# Patient Record
Sex: Male | Born: 1990 | Race: White | Hispanic: No | Smoking: Current some day smoker
Health system: Southern US, Community
[De-identification: ages and names within clinical notes are randomized; demographics above are authoritative.]

## PROBLEM LIST (undated history)

## (undated) DIAGNOSIS — F329 Major depressive disorder, single episode, unspecified: Secondary | ICD-10-CM

## (undated) DIAGNOSIS — R0600 Dyspnea, unspecified: Secondary | ICD-10-CM

## (undated) DIAGNOSIS — F419 Anxiety disorder, unspecified: Secondary | ICD-10-CM

## (undated) DIAGNOSIS — F32A Depression, unspecified: Secondary | ICD-10-CM

## (undated) DIAGNOSIS — I10 Essential (primary) hypertension: Secondary | ICD-10-CM

## (undated) NOTE — *Deleted (*Deleted)
Patient ID: Samuel Tate, male   DOB: July 13, 1990, 14 y.o.   MRN: [id number] Patient discharged to home/self care on his own accord.  Patient denies SI, HI and AVH

---
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
## 2019-01-03 ENCOUNTER — Ambulatory Visit (INDEPENDENT_AMBULATORY_CARE_PROVIDER_SITE_OTHER): Payer: 59 | Admitting: Orthopaedic Surgery

## 2019-01-03 ENCOUNTER — Ambulatory Visit (INDEPENDENT_AMBULATORY_CARE_PROVIDER_SITE_OTHER): Payer: 59

## 2019-01-03 ENCOUNTER — Encounter: Payer: Self-pay | Admitting: Orthopaedic Surgery

## 2019-01-03 ENCOUNTER — Other Ambulatory Visit: Payer: Self-pay

## 2019-01-03 DIAGNOSIS — M87051 Idiopathic aseptic necrosis of right femur: Secondary | ICD-10-CM

## 2019-01-03 DIAGNOSIS — M25552 Pain in left hip: Secondary | ICD-10-CM

## 2019-01-03 DIAGNOSIS — M25551 Pain in right hip: Secondary | ICD-10-CM

## 2019-01-03 DIAGNOSIS — M87052 Idiopathic aseptic necrosis of left femur: Secondary | ICD-10-CM | POA: Insufficient documentation

## 2019-01-03 NOTE — Progress Notes (Signed)
Office Visit Note   Patient: Samuel Tate           Date of Birth: Aug 31, 1990           MRN: [id number] Visit Date: 01/03/2019              Requested by: No referring provider defined for this encounter. PCP: No primary care provider on file.   Assessment & Plan: Visit Diagnoses:  1. Pain in left hip   2. Pain in right hip   3. Avascular necrosis of bone of hip, left ([place])   4. Avascular necrosis of bone of hip, right ([place])     Plan: At this point I do feel that he would benefit from bilateral total hip arthroplasties.  I am comfortable with doing these both at once when also comfortable with performing one hip replacement and then doing the other one at a later date depending on his recovery.  I do not think there is good options for him now nor does he.  He is only 28 years old but his impending femoral head collapse is significant and his pain is significant and severe.  It is affecting his gait to such an extent that I do feel that hip replacement are medically necessary and warranted.  I showed him a hip model explained in detail what the surgery involves.  We talked about the risk and benefits of surgery.  We discussed his interoperative and postoperative course.  All questions concerns were answered and addressed.  He is going to talk to his family.  He has our surgery scheduler's card but we can work on released considering a surgical date for sometime in late November or early December.  Follow-Up Instructions: Return for 2 weeks post-op.   Orders:  Orders Placed This Encounter  Procedures  . XR HIPS BILAT W OR W/O PELVIS 2V   No orders of the defined types were placed in this encounter.     Procedures: No procedures performed   Clinical Data: No additional findings.   Subjective: Chief Complaint  Patient presents with  . Left Hip - Pain  . Right Hip - Pain  The patient is a very pleasant 28 year old gentleman with bilateral hip avascular necrosis that is been  well-documented.  He was followed in [place] and MRIs confirmed bilateral hip AVN.  His hip pain is gotten rapidly worse over the last 6 months.  He walks with a significant Trendelenburg gait.  His hip pain is daily and is detrimentally affecting his mobility, his quality of life, and his actives of daily living.  It is 10 out of 10.  He does report that he has been on steroids off and on the past since a young age for different things.  He has drank alcohol in the past but not to any excessive amount he states.  He was told in [place] that he would eventually need hip replacements but they were trying to hold off given his young age of 26.  He weighs probably about 245 pounds.  They did try alendronate to see if this would help with his bone and pain but it has not.  He is a very active individual.  He is relocating to this area.  He is not a diabetic.  HPI  Review of Systems He currently denies any headache, chest pain, shortness of breath, fever, chills, nausea, vomiting  Objective: Vital Signs: There were no vitals taken for this visit.  Physical Exam He is  alert and oriented x3 and in no acute distress Ortho Exam He walks with a significant Trendelenburg gait and has significant pain with ambulating.  On the exam table, his hips are very painful with attempts of internal and external rotation and very stiff. Specialty Comments:  No specialty comments available.  Imaging: No results found.   PMFS History: Patient Active Problem List   Diagnosis Date Noted  . Avascular necrosis of bone of hip, left ([facility]) 01/03/2019  . Avascular necrosis of bone of hip, right ([facility]) 01/03/2019   History reviewed. No pertinent past medical history.  History reviewed. No pertinent family history.  History reviewed. No pertinent surgical history. Social History   Occupational History  . Not on file  Tobacco Use  . Smoking status: Not on file  Substance and Sexual Activity  . Alcohol use: Not on  file  . Drug use: Not on file  . Sexual activity: Not on file

## 2019-01-10 ENCOUNTER — Telehealth: Payer: Self-pay | Admitting: Orthopaedic Surgery

## 2019-01-10 NOTE — Telephone Encounter (Signed)
Pt mother Elyn Aquas called in for pt trying to reach sherrie, pt is very upset due to her leaving 3 messages since Thursday and hasn't received a call back. Pt mother says her son is moving from [place] to [place] so they need to put in place a date and time for surgery.   Please give her a call [phone]

## 2019-01-10 NOTE — Telephone Encounter (Signed)
I called patient's mom and scheduled surgery.

## 2019-02-20 ENCOUNTER — Other Ambulatory Visit: Payer: Self-pay

## 2019-02-25 ENCOUNTER — Other Ambulatory Visit: Payer: Self-pay | Admitting: Physician Assistant

## 2019-02-26 ENCOUNTER — Other Ambulatory Visit: Payer: Self-pay

## 2019-02-26 ENCOUNTER — Other Ambulatory Visit (HOSPITAL_COMMUNITY)
Admission: RE | Admit: 2019-02-26 | Discharge: 2019-02-26 | Disposition: A | Discharge status: Home / Self Care | Payer: 59 | Source: Ambulatory Visit | Attending: Orthopaedic Surgery | Admitting: Orthopaedic Surgery

## 2019-02-26 ENCOUNTER — Other Ambulatory Visit (HOSPITAL_COMMUNITY): Payer: 59

## 2019-02-26 DIAGNOSIS — Z01812 Encounter for preprocedural laboratory examination: Secondary | ICD-10-CM | POA: Insufficient documentation

## 2019-02-26 DIAGNOSIS — Z20828 Contact with and (suspected) exposure to other viral communicable diseases: Secondary | ICD-10-CM | POA: Insufficient documentation

## 2019-02-26 LAB — SARS CORONAVIRUS 2 (TAT 6-24 HRS): SARS Coronavirus 2: NEGATIVE

## 2019-02-26 NOTE — Patient Instructions (Signed)
DUE TO COVID-19 ONLY ONE VISITOR IS ALLOWED TO COME WITH YOU AND STAY IN THE WAITING ROOM ONLY DURING PRE OP AND PROCEDURE DAY OF SURGERY. THE 1 VISITOR MAY VISIT WITH YOU AFTER SURGERY IN YOUR PRIVATE ROOM DURING VISITING HOURS ONLY!   ONCE YOUR COVID TEST IS COMPLETED, PLEASE BEGIN THE QUARANTINE INSTRUCTIONS AS OUTLINED IN YOUR HANDOUT.                Samuel Tate    Your procedure is scheduled on: 03/01/19   Report to [facility] Main  Entrance   Report to admitting at   9:45 AM     Call this number if you have problems the morning of surgery [phone]   . BRUSH YOUR TEETH MORNING OF SURGERY AND RINSE YOUR MOUTH OUT, NO CHEWING GUM CANDY OR MINTS.   Do not eat food After Midnight.   YOU MAY HAVE CLEAR LIQUIDS FROM MIDNIGHT UNTIL 9:00 AM    CLEAR LIQUID DIET   Foods Allowed                                                                     Foods Excluded  Coffee and tea, regular and decaf                             liquids that you cannot  Plain Jell-O any favor except red or purple                                           see through such as: Fruit ices (not with fruit pulp)                                     milk, soups, orange juice  Iced Popsicles                                    All solid food Carbonated beverages, regular and diet                                    Cranberry, grape and apple juices Sports drinks like Gatorade Lightly seasoned clear broth or consume(fat free) Sugar, honey syrup   . At 9:00 AM Please finish the prescribed Pre-Surgery  drink.   Nothing by mouth after you finish the  drink !    Take these medicines the morning of surgery with A SIP OF WATER: Prozac, Metoprolol, Fosomax.             Use your inhaler and bring it to the hospital with you                                 You may not have any metal on your body including piercings  Do not wear jewelry,  lotions, powders or deodorant       .   Men may shave face and neck.   Do not bring valuables to the hospital. [facility] IS NOT             RESPONSIBLE   FOR VALUABLES.  Contacts, dentures or bridgework may not be worn into surgery.       Special Instructions: N/A              Please read over the following fact sheets you were given: _____________________________________________________________________             [facility]  Before surgery, you can play an important role.   Because skin is not sterile, your skin needs to be as free of germs as possible.   You can reduce the number of germs on your skin by washing with CHG (chlorahexidine gluconate) soap before surgery.   CHG is an antiseptic cleaner which kills germs and bonds with the skin to continue killing germs even after washing. Please DO NOT use if you have an allergy to CHG or antibacterial soaps.   If your skin becomes reddened/irritated stop using the CHG and inform your nurse when you arrive at Short Stay.   You may shave your face/neck.  Please follow these instructions carefully:  1.  Shower with CHG Soap the night before surgery and the  morning of Surgery.  2.  If you choose to wash your hair, wash your hair first as usual with your  normal  shampoo.  3.  After you shampoo, rinse your hair and body thoroughly to remove the  shampoo.                                        4.  Use CHG as you would any other liquid soap.  You can apply chg directly  to the skin and wash                       Gently with a scrungie or clean washcloth.  5.  Apply the CHG Soap to your body ONLY FROM THE NECK DOWN.   Do not use on face/ open                           Wound or open sores. Avoid contact with eyes, ears mouth and genitals (private parts).                       Wash face,  Genitals (private parts) with your normal soap.             6.  Wash thoroughly, paying special attention to the area where your surgery  will be performed.  7.   Thoroughly rinse your body with warm water from the neck down.  8.  DO NOT shower/wash with your normal soap after using and rinsing off  the CHG Soap.             9.  Pat yourself dry with a clean towel.            10.  Wear clean pajamas.            11.  Place clean sheets on your bed the night of your first  shower and do not  sleep with pets. Day of Surgery : Do not apply any lotions/deodorants the morning of surgery.  Please wear clean clothes to the hospital/surgery center.  FAILURE TO FOLLOW THESE INSTRUCTIONS MAY RESULT IN THE CANCELLATION OF YOUR SURGERY PATIENT SIGNATURE_________________________________  NURSE SIGNATURE__________________________________  ________________________________________________________________________   [name]  An incentive spirometer is a tool that can help keep your lungs clear and active. This tool measures how well you are filling your lungs with each breath. Taking long deep breaths may help reverse or decrease the chance of developing breathing (pulmonary) problems (especially infection) following:  A long period of time when you are unable to move or be active. BEFORE THE PROCEDURE   If the spirometer includes an indicator to show your best effort, your nurse or respiratory therapist will set it to a desired goal.  If possible, sit up straight or lean slightly forward. Try not to slouch.  Hold the incentive spirometer in an upright position. INSTRUCTIONS FOR USE  1. Sit on the edge of your bed if possible, or sit up as far as you can in bed or on a chair. 2. Hold the incentive spirometer in an upright position. 3. Breathe out normally. 4. Place the mouthpiece in your mouth and seal your lips tightly around it. 5. Breathe in slowly and as deeply as possible, raising the piston or the ball toward the top of the column. 6. Hold your breath for 3-5 seconds or for as long as possible. Allow the piston or ball to fall to the bottom of  the column. 7. Remove the mouthpiece from your mouth and breathe out normally. 8. Rest for a few seconds and repeat Steps 1 through 7 at least 10 times every 1-2 hours when you are awake. Take your time and take a few normal breaths between deep breaths. 9. The spirometer may include an indicator to show your best effort. Use the indicator as a goal to work toward during each repetition. 10. After each set of 10 deep breaths, practice coughing to be sure your lungs are clear. If you have an incision (the cut made at the time of surgery), support your incision when coughing by placing a pillow or rolled up towels firmly against it. Once you are able to get out of bed, walk around indoors and cough well. You may stop using the incentive spirometer when instructed by your caregiver.  RISKS AND COMPLICATIONS  Take your time so you do not get dizzy or light-headed.  If you are in pain, you may need to take or ask for pain medication before doing incentive spirometry. It is harder to take a deep breath if you are having pain. AFTER USE  Rest and breathe slowly and easily.  It can be helpful to keep track of a log of your progress. Your caregiver can provide you with a simple table to help with this. If you are using the spirometer at home, follow these instructions: [place] IF:   You are having difficultly using the spirometer.  You have trouble using the spirometer as often as instructed.  Your pain medication is not giving enough relief while using the spirometer.  You develop fever of 100.5 F (38.1 C) or higher. SEEK IMMEDIATE MEDICAL CARE IF:   You cough up bloody sputum that had not been present before.  You develop fever of 102 F (38.9 C) or greater.  You develop worsening pain at or near the incision site. MAKE  SURE YOU:   Understand these instructions.  Will watch your condition.  Will get help right away if you are not doing well or get worse. Document  Released: 07/18/2006 Document Revised: 05/30/2011 Document Reviewed: 09/18/2006 [facility] Patient Information 2014 [place], [place].   ________________________________________________________________________

## 2019-02-27 ENCOUNTER — Encounter (HOSPITAL_COMMUNITY)
Admission: RE | Admit: 2019-02-27 | Discharge: 2019-02-27 | Disposition: A | Discharge status: Home / Self Care | Payer: 59 | Source: Ambulatory Visit | Attending: Orthopaedic Surgery | Admitting: Orthopaedic Surgery

## 2019-02-27 ENCOUNTER — Ambulatory Visit
Admission: EM | Admit: 2019-02-27 | Discharge: 2019-02-27 | Disposition: A | Discharge status: Home / Self Care | Payer: 59 | Source: Home / Self Care

## 2019-02-27 ENCOUNTER — Other Ambulatory Visit: Payer: Self-pay

## 2019-02-27 ENCOUNTER — Encounter (HOSPITAL_COMMUNITY): Payer: Self-pay

## 2019-02-27 DIAGNOSIS — I10 Essential (primary) hypertension: Secondary | ICD-10-CM

## 2019-02-27 DIAGNOSIS — Z01818 Encounter for other preprocedural examination: Secondary | ICD-10-CM | POA: Insufficient documentation

## 2019-02-27 DIAGNOSIS — M87059 Idiopathic aseptic necrosis of unspecified femur: Secondary | ICD-10-CM | POA: Insufficient documentation

## 2019-02-27 DIAGNOSIS — E669 Obesity, unspecified: Secondary | ICD-10-CM

## 2019-02-27 HISTORY — DX: Depression, unspecified: F32.A

## 2019-02-27 HISTORY — DX: Essential (primary) hypertension: I10

## 2019-02-27 HISTORY — DX: Dyspnea, unspecified: R06.00

## 2019-02-27 HISTORY — DX: Anxiety disorder, unspecified: F41.9

## 2019-02-27 LAB — BASIC METABOLIC PANEL
Anion gap: 12 (ref 5–15)
BUN: 14 mg/dL (ref 6–20)
CO2: 26 mmol/L (ref 22–32)
Calcium: 9.4 mg/dL (ref 8.9–10.3)
Chloride: 101 mmol/L (ref 98–111)
Creatinine, Ser: 1.01 mg/dL (ref 0.61–1.24)
GFR calc Af Amer: >60 mL/min (ref >60)
GFR calc non Af Amer: >60 mL/min (ref >60)
Glucose, Bld: 97 mg/dL (ref 70–99)
Potassium: 3.9 mmol/L (ref 3.5–5.1)
Sodium: 139 mmol/L (ref 135–145)

## 2019-02-27 LAB — CBC
HCT: 48.4 % (ref 39.0–52.0)
Hemoglobin: 15.8 g/dL (ref 13.0–17.0)
MCH: 30.6 pg (ref 26.0–34.0)
MCHC: 32.6 g/dL (ref 30.0–36.0)
MCV: 93.8 fL (ref 80.0–100.0)
Platelets: 483 10*3/uL — ABNORMAL HIGH (ref 150–400)
RBC: 5.16 MIL/uL (ref 4.22–5.81)
RDW: 12.5 % (ref 11.5–15.5)
WBC: 10 10*3/uL (ref 4.0–10.5)
nRBC: 0 % (ref 0.0–0.2)

## 2019-02-27 LAB — SURGICAL PCR SCREEN
MRSA, PCR: NEGATIVE
Staphylococcus aureus: POSITIVE — AB

## 2019-02-27 LAB — ABO/RH: ABO/RH(D): O POS

## 2019-02-27 MED ORDER — AMLODIPINE BESYLATE 2.5 MG PO TABS: 2.5000 mg | ORAL_TABLET | Freq: Every day | ORAL | 0 refills | Status: DC

## 2019-02-27 NOTE — ED Provider Notes (Signed)
[facility]   MRN: [id number] DOB: [birth year]  Subjective:   Samuel Tate is a 28 y.o. male presenting for check on his hypertension.  Patient was having blood work drawn today for preop labs as he is having bilateral hip replacement due to bilateral avascular necrosis of the bone.  He was found to have an elevated blood pressure there and was advised to come to our urgent care for checkup.  He is currently taking losartan, hydrochlorothiazide and metoprolol.   No current facility-administered medications for this encounter.   Current Outpatient Medications:  .  acetaminophen (TYLENOL) 500 MG tablet, Take 1,000 mg by mouth every 6 (six) hours as needed for moderate pain., Disp: , Rfl:  .  albuterol (VENTOLIN HFA) 108 (90 Base) MCG/ACT inhaler, Inhale 1-2 puffs into the lungs every 6 (six) hours as needed for wheezing or shortness of breath., Disp: , Rfl:  .  alendronate (FOSAMAX) 70 MG tablet, Take 70 mg by mouth once a week. Take with a full glass of water on an empty stomach., Disp: , Rfl:  .  diphenhydramine-acetaminophen (TYLENOL PM) 25-500 MG TABS tablet, Take 2 tablets by mouth at bedtime as needed (sleep/pain)., Disp: , Rfl:  .  FLUoxetine (PROZAC) 20 MG capsule, Take 20 mg by mouth daily., Disp: , Rfl:  .  hydrochlorothiazide (HYDRODIURIL) 25 MG tablet, Take 25 mg by mouth daily., Disp: , Rfl:  .  losartan-hydrochlorothiazide (HYZAAR) 100-25 MG tablet, Take 1 tablet by mouth daily., Disp: , Rfl:  .  metoprolol succinate (TOPROL-XL) 25 MG 24 hr tablet, Take 25 mg by mouth daily., Disp: , Rfl:  .  omeprazole (PRILOSEC) 20 MG capsule, Take 20 mg by mouth daily., Disp: , Rfl:  .  QUEtiapine (SEROQUEL) 100 MG tablet, Take 100 mg by mouth at bedtime., Disp: , Rfl:     No Known Allergies   Past Medical History:  Diagnosis Date  . Anxiety   . Depression   . Dyspnea    with panic attacks. uses inhaler  . Family history of adverse reaction to anesthesia    grandmother  has PONV  . Hypertension      Past Surgical History:  Procedure Laterality Date  . FRACTURE SURGERY Right 2010    Family History  Problem Relation Age of Onset  . Healthy Mother   . Healthy Father     Social History   Tobacco Use  . Smoking status: Current Some Day Smoker    Years: 4.00    Types: Cigars  . Smokeless tobacco: Current User    Types: Snuff  Substance Use Topics  . Alcohol use: Yes    Alcohol/week: 6.0 standard drinks    Types: 6 Standard drinks or equivalent per week  . Drug use: Never    Review of Systems  Constitutional: Negative for fever and malaise/fatigue.  HENT: Negative for congestion, ear pain, sinus pain and sore throat.   Eyes: Negative for discharge and redness.  Respiratory: Negative for cough, hemoptysis, shortness of breath and wheezing.   Cardiovascular: Negative for chest pain.  Gastrointestinal: Negative for abdominal pain, blood in stool, constipation, diarrhea, nausea and vomiting.  Genitourinary: Negative for dysuria, flank pain and hematuria.  Musculoskeletal: Positive for joint pain (chronic hip pain). Negative for myalgias.  Skin: Negative for rash.  Neurological: Negative for dizziness, tingling, tremors, sensory change, speech change, focal weakness, weakness and headaches.  Psychiatric/Behavioral: Negative for depression and substance abuse.     Objective:   Vitals: BP Marland Kitchen(!)  143/90 (BP Location: Right Arm)   Pulse 75   Temp 98.9 F (37.2 C) (Oral)   Resp 18   SpO2 96%   BP Readings from Last 3 Encounters:  02/27/19 (!) 143/90  02/27/19 (!) 149/105   Physical Exam Constitutional:      General: He is not in acute distress.    Appearance: Normal appearance. He is well-developed. He is obese. He is not ill-appearing, toxic-appearing or diaphoretic.  HENT:     Head: Normocephalic and atraumatic.     Right Ear: External ear normal.     Left Ear: External ear normal.     Nose: Nose normal.     Mouth/Throat:      Mouth: Mucous membranes are moist.     Pharynx: Oropharynx is clear.  Eyes:     General: No scleral icterus.    Extraocular Movements: Extraocular movements intact.     Pupils: Pupils are equal, round, and reactive to light.  Neck:     Musculoskeletal: Normal range of motion and neck supple. No neck rigidity.  Cardiovascular:     Rate and Rhythm: Normal rate and regular rhythm.     Heart sounds: Normal heart sounds. No murmur. No friction rub. No gallop.   Pulmonary:     Effort: Pulmonary effort is normal. No respiratory distress.     Breath sounds: Normal breath sounds. No stridor. No wheezing, rhonchi or rales.  Skin:    General: Skin is warm and dry.  Neurological:     Mental Status: He is alert and oriented to person, place, and time.     Cranial Nerves: No cranial nerve deficit.     Motor: No weakness.     Coordination: Coordination normal.     Gait: Gait normal.     Deep Tendon Reflexes: Reflexes normal.     Comments: Negative Romberg and pronator drift.  Psychiatric:        Mood and Affect: Mood normal.        Behavior: Behavior normal.        Thought Content: Thought content normal.        Judgment: Judgment normal.     Results for orders placed or performed during the hospital encounter of 02/27/19 (from the past 24 hour(s))  Type and screen Order type and screen if day of surgery is less than 15 days from draw of preadmission visit or order morning of surgery if day of surgery is greater than 6 days from preadmission visit.     Status: None   Collection Time: 02/27/19 11:00 AM  Result Value Ref Range   ABO/RH(D) O POS    Antibody Screen NEG    Sample Expiration 03/13/2019,2359    Extend sample reason      NO TRANSFUSIONS OR PREGNANCY IN THE PAST 3 MONTHS Performed at [facility], [address], [place], [place] [address]   Basic metabolic panel     Status: None   Collection Time: 02/27/19 11:00 AM  Result Value Ref Range   Sodium 139 135 -  145 mmol/L   Potassium 3.9 3.5 - 5.1 mmol/L   Chloride 101 98 - 111 mmol/L   CO2 26 22 - 32 mmol/L   Glucose, Bld 97 70 - 99 mg/dL   BUN 14 6 - 20 mg/dL   Creatinine, Ser 7.34 0.61 - 1.24 mg/dL   Calcium 9.4 8.9 - 28.7 mg/dL   GFR calc non Af Amer >60 >60 mL/min   GFR  calc Af Amer >60 >60 mL/min   Anion gap 12 5 - 15  CBC     Status: Abnormal   Collection Time: 02/27/19 11:00 AM  Result Value Ref Range   WBC 10.0 4.0 - 10.5 K/uL   RBC 5.16 4.22 - 5.81 MIL/uL   Hemoglobin 15.8 13.0 - 17.0 g/dL   HCT 48.4 39.0 - 52.0 %   MCV 93.8 80.0 - 100.0 fL   MCH 30.6 26.0 - 34.0 pg   MCHC 32.6 30.0 - 36.0 g/dL   RDW 12.5 11.5 - 15.5 %   Platelets 483 (H) 150 - 400 K/uL   nRBC 0.0 0.0 - 0.2 %  ABO/Rh     Status: None (Preliminary result)   Collection Time: 02/27/19 11:00 AM  Result Value Ref Range   ABO/RH(D)      O POS Performed at [facility], [place] [address], [place], [place] [address]     Assessment and Plan :   1. Essential hypertension   2. Elevated blood pressure reading in office with diagnosis of hypertension   3. Obesity, unspecified classification, unspecified obesity type, unspecified whether serious comorbidity present     Patient is asymptomatic and borderline uncontrolled.  However, this is the first time I see the patient today and are not have previous readings for comparison.  Patient is very weary of having his surgery canceled due to his blood pressure.  Therefore, I was agreeable to using amlodipine at 2.5 mg once daily to help with this. Counseled patient on potential for adverse effects with medications prescribed/recommended today, ER and return-to-clinic precautions discussed, patient verbalized understanding.    Jaynee Eagles, PA-C 02/27/19 1356

## 2019-02-27 NOTE — ED Triage Notes (Signed)
Pt presents to UC w/ c/o high blood pressure. Pt states he has been taking HBP medication as prescribed. Pt had a surgery preauthorization today where his BP was 141/101, so they told him to come to UC to be evaluated. [place] and does not have a pcp at this time.

## 2019-02-27 NOTE — Discharge Instructions (Signed)
For your diabetes, please make sure you are avoiding starchy, carbohydrate foods like pasta, breads, pastry, rice, potatoes, desserts. These foods can elevated your blood sugar. Also, limit your alcohol drinking to 1 per day, avoid sodas, sweet teas. For elevated blood pressure, make sure you are monitoring salt in your diet.  Do not eat restaurant foods and limit processed foods at home.  Processed foods include things like frozen meals preseason meats and dinners.  Make sure your pain attention to sodium labels on foods you by at the grocery store.  For seasoning you can use a brand called Mrs. Dash which includes a lot of salt free seasonings. ° °Salads - kale, spinach, cabbage, spring mix; use seeds like pumpkin seeds or sunflower seeds, almonds; you can also use 1-2 hard boiled eggs in your salads °Fruits - avocadoes, berries (blueberries, raspberries, blackberries), apples, oranges °Vegetables - aspargus, cauliflower, broccoli, green beans, brussel spouts, bell peppers; stay away from starchy vegetables like potatoes, carrots, peas ° °Regarding meat it is better to eat lean meats and limit your red meat consumption including pork.  Wild caught fish, chicken breast are good options. ° °Do not eat any foods on this list that you are allergic to. ° °

## 2019-02-27 NOTE — Progress Notes (Addendum)
PCP - none in the area Cardiologist - none  Chest x-ray - no EKG - 02/27/19 Stress Test - no ECHO - no Cardiac Cath - no  Sleep Study - no CPAP -   Fasting Blood Sugar - NA Checks Blood Sugar _____ times a day  Blood Thinner Instructions: Aspirin Instructions:Na Last Dose:  Anesthesia review:   Patient denies shortness of breath, fever, cough and chest pain at PAT appointment yes  Patient verbalized understanding of instructions that were given to them at the PAT appointment. Patient was also instructed that they will need to review over the PAT instructions again at home before surgery. Yes BP was elevated at PAT visit. Pt is new to the area. He was told to see an urgent care to get his BP under control prior to surgery

## 2019-02-28 ENCOUNTER — Other Ambulatory Visit (HOSPITAL_COMMUNITY): Payer: 59

## 2019-02-28 ENCOUNTER — Encounter (HOSPITAL_COMMUNITY): Payer: Self-pay | Admitting: Orthopaedic Surgery

## 2019-02-28 DIAGNOSIS — M87052 Idiopathic aseptic necrosis of left femur: Secondary | ICD-10-CM

## 2019-02-28 DIAGNOSIS — M87051 Idiopathic aseptic necrosis of right femur: Secondary | ICD-10-CM

## 2019-02-28 HISTORY — DX: Idiopathic aseptic necrosis of right femur: M87.051

## 2019-02-28 HISTORY — DX: Idiopathic aseptic necrosis of left femur: M87.052

## 2019-02-28 NOTE — Anesthesia Preprocedure Evaluation (Addendum)
Anesthesia Evaluation  Patient identified by MRN, date of birth, ID band Patient awake    Reviewed: Allergy & Precautions, NPO status , Patient's Chart, lab work & pertinent test results  Airway Mallampati: II  TM Distance: >3 FB Neck ROM: Full    Dental  (+) Teeth Intact, Dental Advisory Given,    Pulmonary shortness of breath, Current Smoker,  Albuterol use - a few days, no formal diagnosis of asthma   Pulmonary exam normal breath sounds clear to auscultation       Cardiovascular hypertension, Pt. on medications Normal cardiovascular exam Rhythm:Regular Rate:Normal     Neuro/Psych PSYCHIATRIC DISORDERS Anxiety Depression negative neurological ROS     GI/Hepatic negative GI ROS, Neg liver ROS,   Endo/Other  Morbid obesityBMI 40  Renal/GU negative Renal ROS  negative genitourinary   Musculoskeletal B/L avascular necrosis of hips   Abdominal (+) + obese,   Peds  Hematology negative hematology ROS (+)   Anesthesia Other Findings   Reproductive/Obstetrics negative OB ROS                            Anesthesia Physical Anesthesia Plan  ASA: III  Anesthesia Plan: Spinal and MAC   Post-op Pain Management:    Induction:   PONV Risk Score and Plan: 1 and Propofol infusion, TIVA and Treatment may vary due to age or medical condition  Airway Management Planned: Natural Airway and Simple Face Mask  Additional Equipment: None  Intra-op Plan:   Post-operative Plan:   Informed Consent: I have reviewed the patients History and Physical, chart, labs and discussed the procedure including the risks, benefits and alternatives for the proposed anesthesia with the patient or authorized representative who has indicated his/her understanding and acceptance.       Plan Discussed with: CRNA  Anesthesia Plan Comments:        Anesthesia Quick Evaluation

## 2019-02-28 NOTE — H&P (Signed)
TOTAL HIP ADMISSION H&P  Patient is admitted for bilaterally total hip arthroplasty.  Subjective:  Chief Complaint: bilaterally hip pain  HPI: Samuel Tate, 28 y.o. male, has a history of pain and functional disability in the bilaterally hip(s) due to avascular necrosis and patient has failed non-surgical conservative treatments for greater than 12 weeks to include NSAID's and/or analgesics, corticosteriod injections, flexibility and strengthening excercises, weight reduction as appropriate and activity modification.  Onset of symptoms was abrupt starting 1 years ago with rapidlly worsening course since that time.The patient noted no past surgery on the bilaterally hip(s).  Patient currently rates pain in the bilaterally hip at 10 out of 10 with activity. Patient has night pain, worsening of pain with activity and weight bearing, trendelenberg gait, pain that interfers with activities of daily living and pain with passive range of motion. Patient has evidence of subchondral cysts by imaging studies. This condition presents safety issues increasing the risk of falls.  There is no current active infection.  Patient Active Problem List   Diagnosis Date Noted  . Avascular necrosis of bones of both hips (HCC) 02/28/2019  . Avascular necrosis of bone of hip, left (HCC) 01/03/2019  . Avascular necrosis of bone of hip, right (HCC) 01/03/2019   Past Medical History:  Diagnosis Date  . Anxiety   . Depression   . Dyspnea    with panic attacks. uses inhaler  . Hypertension     Past Surgical History:  Procedure Laterality Date  . FRACTURE SURGERY Right 2010    No current facility-administered medications for this encounter.   Current Outpatient Medications  Medication Sig Dispense Refill Last Dose  . acetaminophen (TYLENOL) 500 MG tablet Take 1,000 mg by mouth every 6 (six) hours as needed for moderate pain.     Marland Kitchen albuterol (VENTOLIN HFA) 108 (90 Base) MCG/ACT inhaler Inhale 1-2 puffs into the lungs  every 6 (six) hours as needed for wheezing or shortness of breath.     Marland Kitchen alendronate (FOSAMAX) 70 MG tablet Take 70 mg by mouth once a week. Take with a full glass of water on an empty stomach.     . diphenhydramine-acetaminophen (TYLENOL PM) 25-500 MG TABS tablet Take 2 tablets by mouth at bedtime as needed (sleep/pain).     Marland Kitchen FLUoxetine (PROZAC) 20 MG capsule Take 20 mg by mouth daily.     . hydrochlorothiazide (HYDRODIURIL) 25 MG tablet Take 25 mg by mouth daily.     Marland Kitchen losartan-hydrochlorothiazide (HYZAAR) 100-25 MG tablet Take 1 tablet by mouth daily.     . metoprolol succinate (TOPROL-XL) 25 MG 24 hr tablet Take 25 mg by mouth daily.     Marland Kitchen omeprazole (PRILOSEC) 20 MG capsule Take 20 mg by mouth daily.     . QUEtiapine (SEROQUEL) 100 MG tablet Take 100 mg by mouth at bedtime.     Marland Kitchen amLODipine (NORVASC) 2.5 MG tablet Take 1 tablet (2.5 mg total) by mouth daily. 30 tablet 0    No Known Allergies  Social History   Tobacco Use  . Smoking status: Current Some Day Smoker    Years: 4.00    Types: Cigars  . Smokeless tobacco: Current User    Types: Snuff  Substance Use Topics  . Alcohol use: Yes    Alcohol/week: 6.0 standard drinks    Types: 6 Standard drinks or equivalent per week    Family History  Problem Relation Age of Onset  . Healthy Mother   . Healthy Father  Review of Systems  All other systems reviewed and are negative.   Objective:  Physical Exam  Constitutional: He is oriented to person, place, and time. He appears well-developed and well-nourished.  HENT:  Head: Normocephalic and atraumatic.  Eyes: Pupils are equal, round, and reactive to light. EOM are normal.  Cardiovascular: Normal rate and regular rhythm.  Respiratory: Effort normal and breath sounds normal.  GI: Soft. Bowel sounds are normal.  Musculoskeletal:     Cervical back: Normal range of motion and neck supple.     Right hip: Tenderness and bony tenderness present. Decreased range of motion.  Decreased strength.     Left hip: Tenderness and bony tenderness present. Decreased range of motion. Decreased strength.  Neurological: He is alert and oriented to person, place, and time.  Skin: Skin is warm and dry.  Psychiatric: He has a normal mood and affect.    Vital signs in last 24 hours:    Labs:   Estimated body mass index is 39.71 kg/m as calculated from the following:   Height as of 02/27/19: 5\' 6"  (1.676 m).   Weight as of 02/27/19: 111.6 kg.   Imaging Review Plain radiographs demonstrate severe AVN of the bilateral hip(s). The bone quality appears to be good for age and reported activity level.      Assessment/Plan:  Avascular necrosis, bilaterally hip(s)  The patient history, physical examination, clinical judgement of the provider and imaging studies are consistent with end stage avascular necrosis of the bilaterally hip(s) and total hip arthroplasty is deemed medically necessary. The treatment options including medical management, injection therapy, arthroscopy and arthroplasty were discussed at length. The risks and benefits of total hip arthroplasty were presented and reviewed. The risks due to aseptic loosening, infection, stiffness, dislocation/subluxation,  thromboembolic complications and other imponderables were discussed.  The patient acknowledged the explanation, agreed to proceed with the plan and consent was signed. Patient is being admitted for inpatient treatment for surgery, pain control, PT, OT, prophylactic antibiotics, VTE prophylaxis, progressive ambulation and ADL's and discharge planning.The patient is planning to be discharged home with home health services

## 2019-03-01 ENCOUNTER — Inpatient Hospital Stay (HOSPITAL_COMMUNITY): Payer: 59

## 2019-03-01 ENCOUNTER — Encounter (HOSPITAL_COMMUNITY)
Admission: RE | Disposition: A | Discharge status: Home / Self Care | Payer: Self-pay | Source: Home / Self Care | Attending: Orthopaedic Surgery

## 2019-03-01 ENCOUNTER — Inpatient Hospital Stay (HOSPITAL_COMMUNITY)
Admission: RE | Admit: 2019-03-01 | Discharge: 2019-03-07 | DRG: 462 | Disposition: A | Discharge status: Home Health | Payer: 59 | Attending: Orthopaedic Surgery | Admitting: Orthopaedic Surgery

## 2019-03-01 ENCOUNTER — Inpatient Hospital Stay (HOSPITAL_COMMUNITY): Payer: 59 | Admitting: Anesthesiology

## 2019-03-01 ENCOUNTER — Other Ambulatory Visit: Payer: Self-pay

## 2019-03-01 ENCOUNTER — Encounter (HOSPITAL_COMMUNITY): Payer: Self-pay | Admitting: Orthopaedic Surgery

## 2019-03-01 ENCOUNTER — Inpatient Hospital Stay (HOSPITAL_COMMUNITY): Payer: 59 | Admitting: Physician Assistant

## 2019-03-01 DIAGNOSIS — Z9181 History of falling: Secondary | ICD-10-CM

## 2019-03-01 DIAGNOSIS — M87052 Idiopathic aseptic necrosis of left femur: Secondary | ICD-10-CM

## 2019-03-01 DIAGNOSIS — F419 Anxiety disorder, unspecified: Secondary | ICD-10-CM | POA: Diagnosis present

## 2019-03-01 DIAGNOSIS — M25452 Effusion, left hip: Secondary | ICD-10-CM | POA: Diagnosis present

## 2019-03-01 DIAGNOSIS — M25451 Effusion, right hip: Secondary | ICD-10-CM | POA: Diagnosis present

## 2019-03-01 DIAGNOSIS — M25551 Pain in right hip: Secondary | ICD-10-CM | POA: Diagnosis present

## 2019-03-01 DIAGNOSIS — I10 Essential (primary) hypertension: Secondary | ICD-10-CM | POA: Diagnosis present

## 2019-03-01 DIAGNOSIS — F17290 Nicotine dependence, other tobacco product, uncomplicated: Secondary | ICD-10-CM | POA: Diagnosis present

## 2019-03-01 DIAGNOSIS — Z20828 Contact with and (suspected) exposure to other viral communicable diseases: Secondary | ICD-10-CM | POA: Diagnosis present

## 2019-03-01 DIAGNOSIS — Z7983 Long term (current) use of bisphosphonates: Secondary | ICD-10-CM

## 2019-03-01 DIAGNOSIS — Z419 Encounter for procedure for purposes other than remedying health state, unspecified: Secondary | ICD-10-CM

## 2019-03-01 DIAGNOSIS — Z6839 Body mass index (BMI) 39.0-39.9, adult: Secondary | ICD-10-CM

## 2019-03-01 DIAGNOSIS — F329 Major depressive disorder, single episode, unspecified: Secondary | ICD-10-CM | POA: Diagnosis present

## 2019-03-01 DIAGNOSIS — Z79899 Other long term (current) drug therapy: Secondary | ICD-10-CM

## 2019-03-01 DIAGNOSIS — M87051 Idiopathic aseptic necrosis of right femur: Secondary | ICD-10-CM

## 2019-03-01 DIAGNOSIS — I959 Hypotension, unspecified: Secondary | ICD-10-CM | POA: Diagnosis not present

## 2019-03-01 DIAGNOSIS — M879 Osteonecrosis, unspecified: Secondary | ICD-10-CM | POA: Diagnosis present

## 2019-03-01 DIAGNOSIS — Z96643 Presence of artificial hip joint, bilateral: Secondary | ICD-10-CM

## 2019-03-01 HISTORY — PX: BILATERAL ANTERIOR TOTAL HIP ARTHROPLASTY: SHX5567

## 2019-03-01 LAB — TYPE AND SCREEN
ABO/RH(D): O POS
Antibody Screen: NEGATIVE

## 2019-03-01 SURGERY — ARTHROPLASTY, HIP, BILATERAL, TOTAL, ANTERIOR APPROACH
Anesthesia: Monitor Anesthesia Care | Site: Hip | Laterality: Bilateral

## 2019-03-01 MED ORDER — PANTOPRAZOLE SODIUM 40 MG PO TBEC
40.0000 mg | DELAYED_RELEASE_TABLET | Freq: Every day | ORAL | Status: DC
  Administered 2019-03-01 – 2019-03-07 (×7): 40 mg via ORAL
  Filled 2019-03-01 (×7): qty 1

## 2019-03-01 MED ORDER — ACETAMINOPHEN 500 MG PO TABS
1000.0000 mg | ORAL_TABLET | Freq: Once | ORAL | Status: AC
  Administered 2019-03-01: 10:00:00 1000 mg via ORAL
  Filled 2019-03-01: qty 2

## 2019-03-01 MED ORDER — PROPOFOL 500 MG/50ML IV EMUL
INTRAVENOUS | Status: AC
  Filled 2019-03-01: qty 100

## 2019-03-01 MED ORDER — KETOROLAC TROMETHAMINE 15 MG/ML IJ SOLN
7.5000 mg | Freq: Four times a day (QID) | INTRAMUSCULAR | Status: AC
  Administered 2019-03-01 – 2019-03-02 (×3): 7.5 mg via INTRAVENOUS
  Filled 2019-03-01 (×4): qty 1

## 2019-03-01 MED ORDER — ASPIRIN 81 MG PO CHEW
81.0000 mg | CHEWABLE_TABLET | Freq: Two times a day (BID) | ORAL | Status: DC
  Administered 2019-03-01 – 2019-03-07 (×12): 81 mg via ORAL
  Filled 2019-03-01 (×12): qty 1

## 2019-03-01 MED ORDER — BUPIVACAINE IN DEXTROSE 0.75-8.25 % IT SOLN
INTRATHECAL | Status: DC | PRN
  Administered 2019-03-01: 2 mg via INTRATHECAL

## 2019-03-01 MED ORDER — POVIDONE-IODINE 10 % EX SWAB
2.0000 "application " | Freq: Once | CUTANEOUS | Status: AC
  Administered 2019-03-01: 2 via TOPICAL

## 2019-03-01 MED ORDER — HYDROCHLOROTHIAZIDE 25 MG PO TABS
25.0000 mg | ORAL_TABLET | Freq: Every day | ORAL | Status: DC
  Administered 2019-03-01 – 2019-03-05 (×4): 25 mg via ORAL
  Filled 2019-03-01 (×4): qty 1

## 2019-03-01 MED ORDER — POLYETHYLENE GLYCOL 3350 17 G PO PACK: 17.0000 g | PACK | Freq: Every day | ORAL | Status: DC | PRN

## 2019-03-01 MED ORDER — PHENOL 1.4 % MT LIQD
1.0000 | OROMUCOSAL | Status: DC | PRN
  Filled 2019-03-01: qty 177

## 2019-03-01 MED ORDER — ONDANSETRON HCL 4 MG/2ML IJ SOLN
INTRAMUSCULAR | Status: DC | PRN
  Administered 2019-03-01: 4 mg via INTRAVENOUS

## 2019-03-01 MED ORDER — MENTHOL 3 MG MT LOZG: 1.0000 | LOZENGE | OROMUCOSAL | Status: DC | PRN

## 2019-03-01 MED ORDER — CEFAZOLIN SODIUM-DEXTROSE 1-4 GM/50ML-% IV SOLN
1.0000 g | Freq: Four times a day (QID) | INTRAVENOUS | Status: AC
  Administered 2019-03-01 – 2019-03-02 (×2): 1 g via INTRAVENOUS
  Filled 2019-03-01 (×2): qty 50

## 2019-03-01 MED ORDER — BUPIVACAINE IN DEXTROSE 0.75-8.25 % IT SOLN: INTRATHECAL | Status: DC | PRN

## 2019-03-01 MED ORDER — DEXAMETHASONE SODIUM PHOSPHATE 10 MG/ML IJ SOLN
INTRAMUSCULAR | Status: DC | PRN
  Administered 2019-03-01: 10 mg via INTRAVENOUS

## 2019-03-01 MED ORDER — AMLODIPINE BESYLATE 5 MG PO TABS
2.5000 mg | ORAL_TABLET | Freq: Every day | ORAL | Status: DC
  Administered 2019-03-01 – 2019-03-05 (×5): 2.5 mg via ORAL
  Filled 2019-03-01 (×5): qty 1

## 2019-03-01 MED ORDER — MEPERIDINE HCL 50 MG/ML IJ SOLN: 6.2500 mg | INTRAMUSCULAR | Status: DC | PRN

## 2019-03-01 MED ORDER — METHOCARBAMOL 500 MG PO TABS
500.0000 mg | ORAL_TABLET | Freq: Four times a day (QID) | ORAL | Status: DC | PRN
  Administered 2019-03-01 – 2019-03-05 (×4): 500 mg via ORAL
  Filled 2019-03-01 (×6): qty 1

## 2019-03-01 MED ORDER — PHENYLEPHRINE HCL (PRESSORS) 10 MG/ML IV SOLN
INTRAVENOUS | Status: AC
  Filled 2019-03-01: qty 1

## 2019-03-01 MED ORDER — HYDROMORPHONE HCL 1 MG/ML IJ SOLN
0.5000 mg | INTRAMUSCULAR | Status: DC | PRN
  Administered 2019-03-01: 0.5 mg via INTRAVENOUS
  Filled 2019-03-01: qty 1

## 2019-03-01 MED ORDER — FENTANYL CITRATE (PF) 100 MCG/2ML IJ SOLN
INTRAMUSCULAR | Status: AC
  Filled 2019-03-01: qty 2

## 2019-03-01 MED ORDER — QUETIAPINE FUMARATE 50 MG PO TABS
100.0000 mg | ORAL_TABLET | Freq: Every day | ORAL | Status: DC
  Administered 2019-03-01 – 2019-03-06 (×6): 100 mg via ORAL
  Filled 2019-03-01 (×6): qty 2

## 2019-03-01 MED ORDER — SODIUM CHLORIDE 0.9 % IV SOLN
INTRAVENOUS | Status: DC
  Administered 2019-03-01: 18:00:00 via INTRAVENOUS

## 2019-03-01 MED ORDER — ONDANSETRON HCL 4 MG PO TABS: 4.0000 mg | ORAL_TABLET | Freq: Four times a day (QID) | ORAL | Status: DC | PRN

## 2019-03-01 MED ORDER — 0.9 % SODIUM CHLORIDE (POUR BTL) OPTIME
TOPICAL | Status: DC | PRN
  Administered 2019-03-01: 1000 mL

## 2019-03-01 MED ORDER — DIPHENHYDRAMINE HCL 12.5 MG/5ML PO ELIX: 12.5000 mg | ORAL_SOLUTION | ORAL | Status: DC | PRN

## 2019-03-01 MED ORDER — CEFAZOLIN SODIUM-DEXTROSE 2-4 GM/100ML-% IV SOLN
2.0000 g | INTRAVENOUS | Status: AC
  Administered 2019-03-01: 2 g via INTRAVENOUS
  Filled 2019-03-01: qty 100

## 2019-03-01 MED ORDER — CHLORHEXIDINE GLUCONATE 4 % EX LIQD: 60.0000 mL | Freq: Once | CUTANEOUS | Status: DC

## 2019-03-01 MED ORDER — LOSARTAN POTASSIUM-HCTZ 100-25 MG PO TABS: 1.0000 | ORAL_TABLET | Freq: Every day | ORAL | Status: DC

## 2019-03-01 MED ORDER — FENTANYL CITRATE (PF) 100 MCG/2ML IJ SOLN
INTRAMUSCULAR | Status: DC | PRN
  Administered 2019-03-01 (×2): 50 ug via INTRAVENOUS

## 2019-03-01 MED ORDER — OXYCODONE HCL 5 MG PO TABS
5.0000 mg | ORAL_TABLET | ORAL | Status: DC | PRN
  Administered 2019-03-02 – 2019-03-05 (×3): 10 mg via ORAL
  Administered 2019-03-05: 5 mg via ORAL
  Administered 2019-03-06: 10 mg via ORAL
  Administered 2019-03-06 – 2019-03-07 (×2): 5 mg via ORAL
  Filled 2019-03-01 (×2): qty 2
  Filled 2019-03-01: qty 1
  Filled 2019-03-01 (×2): qty 2
  Filled 2019-03-01: qty 1
  Filled 2019-03-01: qty 2
  Filled 2019-03-01: qty 1
  Filled 2019-03-01 (×4): qty 2

## 2019-03-01 MED ORDER — GABAPENTIN 100 MG PO CAPS
100.0000 mg | ORAL_CAPSULE | Freq: Three times a day (TID) | ORAL | Status: DC
  Administered 2019-03-01 – 2019-03-07 (×17): 100 mg via ORAL
  Filled 2019-03-01 (×18): qty 1

## 2019-03-01 MED ORDER — METHOCARBAMOL 500 MG IVPB - SIMPLE MED
500.0000 mg | Freq: Four times a day (QID) | INTRAVENOUS | Status: DC | PRN
  Filled 2019-03-01: qty 50

## 2019-03-01 MED ORDER — TRANEXAMIC ACID-NACL 1000-0.7 MG/100ML-% IV SOLN
1000.0000 mg | INTRAVENOUS | Status: AC
  Administered 2019-03-01: 1000 mg via INTRAVENOUS
  Filled 2019-03-01: qty 100

## 2019-03-01 MED ORDER — ALUM & MAG HYDROXIDE-SIMETH 200-200-20 MG/5ML PO SUSP: 30.0000 mL | ORAL | Status: DC | PRN

## 2019-03-01 MED ORDER — METOCLOPRAMIDE HCL 5 MG PO TABS: 5.0000 mg | ORAL_TABLET | Freq: Three times a day (TID) | ORAL | Status: DC | PRN

## 2019-03-01 MED ORDER — FLUOXETINE HCL 20 MG PO CAPS
20.0000 mg | ORAL_CAPSULE | Freq: Every day | ORAL | Status: DC
  Administered 2019-03-01 – 2019-03-07 (×7): 20 mg via ORAL
  Filled 2019-03-01 (×7): qty 1

## 2019-03-01 MED ORDER — OXYCODONE HCL 5 MG PO TABS: 5.0000 mg | ORAL_TABLET | Freq: Once | ORAL | Status: DC | PRN

## 2019-03-01 MED ORDER — ALBUTEROL SULFATE (2.5 MG/3ML) 0.083% IN NEBU: 3.0000 mL | INHALATION_SOLUTION | Freq: Four times a day (QID) | RESPIRATORY_TRACT | Status: DC | PRN

## 2019-03-01 MED ORDER — DOCUSATE SODIUM 100 MG PO CAPS
100.0000 mg | ORAL_CAPSULE | Freq: Two times a day (BID) | ORAL | Status: DC
  Administered 2019-03-01 – 2019-03-07 (×12): 100 mg via ORAL
  Filled 2019-03-01 (×12): qty 1

## 2019-03-01 MED ORDER — ACETAMINOPHEN 325 MG PO TABS
325.0000 mg | ORAL_TABLET | Freq: Four times a day (QID) | ORAL | Status: DC | PRN
  Administered 2019-03-04: 650 mg via ORAL
  Filled 2019-03-01: qty 2

## 2019-03-01 MED ORDER — LOSARTAN POTASSIUM 50 MG PO TABS
100.0000 mg | ORAL_TABLET | Freq: Every day | ORAL | Status: DC
  Administered 2019-03-01 – 2019-03-06 (×5): 100 mg via ORAL
  Filled 2019-03-01 (×5): qty 2

## 2019-03-01 MED ORDER — PROPOFOL 500 MG/50ML IV EMUL
INTRAVENOUS | Status: AC
  Filled 2019-03-01: qty 50

## 2019-03-01 MED ORDER — PROMETHAZINE HCL 25 MG/ML IJ SOLN: 6.2500 mg | INTRAMUSCULAR | Status: DC | PRN

## 2019-03-01 MED ORDER — MIDAZOLAM HCL 2 MG/2ML IJ SOLN
INTRAMUSCULAR | Status: AC
  Filled 2019-03-01: qty 2

## 2019-03-01 MED ORDER — PHENYLEPHRINE HCL-NACL 10-0.9 MG/250ML-% IV SOLN
INTRAVENOUS | Status: DC | PRN
  Administered 2019-03-01: 35 ug/min via INTRAVENOUS

## 2019-03-01 MED ORDER — HYDROMORPHONE HCL 1 MG/ML IJ SOLN: 0.2500 mg | INTRAMUSCULAR | Status: DC | PRN

## 2019-03-01 MED ORDER — SODIUM CHLORIDE 0.9 % IR SOLN
Status: DC | PRN
  Administered 2019-03-01 (×2): 1000 mL

## 2019-03-01 MED ORDER — HYDROCHLOROTHIAZIDE 25 MG PO TABS
25.0000 mg | ORAL_TABLET | Freq: Every day | ORAL | Status: DC
  Administered 2019-03-01 – 2019-03-05 (×4): 25 mg via ORAL
  Filled 2019-03-01 (×4): qty 1

## 2019-03-01 MED ORDER — LACTATED RINGERS IV SOLN
INTRAVENOUS | Status: DC
  Administered 2019-03-01: 10:00:00 via INTRAVENOUS

## 2019-03-01 MED ORDER — ONDANSETRON HCL 4 MG/2ML IJ SOLN
INTRAMUSCULAR | Status: AC
  Filled 2019-03-01: qty 2

## 2019-03-01 MED ORDER — PROPOFOL 500 MG/50ML IV EMUL
INTRAVENOUS | Status: DC | PRN
  Administered 2019-03-01: 100 ug/kg/min via INTRAVENOUS

## 2019-03-01 MED ORDER — PROPOFOL 10 MG/ML IV BOLUS
INTRAVENOUS | Status: AC
  Filled 2019-03-01: qty 20

## 2019-03-01 MED ORDER — STERILE WATER FOR IRRIGATION IR SOLN
Status: DC | PRN
  Administered 2019-03-01: 2000 mL

## 2019-03-01 MED ORDER — OXYCODONE HCL 5 MG PO TABS
10.0000 mg | ORAL_TABLET | ORAL | Status: DC | PRN
  Administered 2019-03-01 – 2019-03-03 (×4): 10 mg via ORAL
  Administered 2019-03-03 – 2019-03-04 (×4): 15 mg via ORAL
  Administered 2019-03-05: 10 mg via ORAL
  Filled 2019-03-01 (×4): qty 3
  Filled 2019-03-01: qty 2

## 2019-03-01 MED ORDER — ONDANSETRON HCL 4 MG/2ML IJ SOLN: 4.0000 mg | Freq: Four times a day (QID) | INTRAMUSCULAR | Status: DC | PRN

## 2019-03-01 MED ORDER — DEXAMETHASONE SODIUM PHOSPHATE 10 MG/ML IJ SOLN
INTRAMUSCULAR | Status: AC
  Filled 2019-03-01: qty 1

## 2019-03-01 MED ORDER — OXYCODONE HCL 5 MG/5ML PO SOLN: 5.0000 mg | Freq: Once | ORAL | Status: DC | PRN

## 2019-03-01 MED ORDER — PROPOFOL 10 MG/ML IV BOLUS
INTRAVENOUS | Status: DC | PRN
  Administered 2019-03-01: 20 mg via INTRAVENOUS
  Administered 2019-03-01: 30 mg via INTRAVENOUS

## 2019-03-01 MED ORDER — METOCLOPRAMIDE HCL 5 MG/ML IJ SOLN: 5.0000 mg | Freq: Three times a day (TID) | INTRAMUSCULAR | Status: DC | PRN

## 2019-03-01 MED ORDER — MIDAZOLAM HCL 5 MG/5ML IJ SOLN
INTRAMUSCULAR | Status: DC | PRN
  Administered 2019-03-01: 2 mg via INTRAVENOUS
  Administered 2019-03-01 (×2): 1 mg via INTRAVENOUS

## 2019-03-01 MED ORDER — METOPROLOL SUCCINATE ER 25 MG PO TB24
25.0000 mg | ORAL_TABLET | Freq: Every day | ORAL | Status: DC
  Administered 2019-03-02 – 2019-03-07 (×5): 25 mg via ORAL
  Filled 2019-03-01 (×5): qty 1

## 2019-03-01 MED ORDER — KETOROLAC TROMETHAMINE 30 MG/ML IJ SOLN: 30.0000 mg | Freq: Once | INTRAMUSCULAR | Status: DC | PRN

## 2019-03-01 SURGICAL SUPPLY — 45 items
ARTICULEZE HEAD (Hips) ×6 IMPLANT
BAG ZIPLOCK 12X15 (MISCELLANEOUS) ×2 IMPLANT
BLADE SAW SGTL 18X1.27X75 (BLADE) ×4 IMPLANT
BLADE SAW SGTL 18X1.27X75MM (BLADE) ×2
BLADE SURG SZ10 CARB STEEL (BLADE) ×6 IMPLANT
COVER PERINEAL POST (MISCELLANEOUS) ×3 IMPLANT
COVER SURGICAL LIGHT HANDLE (MISCELLANEOUS) ×5 IMPLANT
COVER WAND RF STERILE (DRAPES) ×2 IMPLANT
DRAPE C-ARM 42X120 X-RAY (DRAPES) ×3 IMPLANT
DRAPE IMP U-DRAPE 54X76 (DRAPES) ×2 IMPLANT
DRAPE STERI IOBAN 125X83 (DRAPES) ×6 IMPLANT
DRAPE U-SHAPE 47X51 STRL (DRAPES) ×11 IMPLANT
DRSG AQUACEL AG ADV 3.5X10 (GAUZE/BANDAGES/DRESSINGS) ×5 IMPLANT
DURAPREP 26ML APPLICATOR (WOUND CARE) ×3 IMPLANT
ELECT BLADE TIP CTD 4 INCH (ELECTRODE) ×3 IMPLANT
ELECT REM PT RETURN 15FT ADLT (MISCELLANEOUS) ×3 IMPLANT
FACESHIELD WRAPAROUND (MASK) ×12 IMPLANT
FACESHIELD WRAPAROUND OR TEAM (MASK) ×4 IMPLANT
GAUZE XEROFORM 1X8 LF (GAUZE/BANDAGES/DRESSINGS) ×4 IMPLANT
GLOVE BIO SURGEON STRL SZ7.5 (GLOVE) ×3 IMPLANT
GLOVE BIOGEL PI IND STRL 8 (GLOVE) ×2 IMPLANT
GLOVE BIOGEL PI INDICATOR 8 (GLOVE) ×4
GLOVE ECLIPSE 8.0 STRL XLNG CF (GLOVE) ×3 IMPLANT
GOWN STRL REUS W/TWL XL LVL3 (GOWN DISPOSABLE) ×12 IMPLANT
HANDPIECE INTERPULSE COAX TIP (DISPOSABLE) ×4
HEAD ARTICULEZE (Hips) IMPLANT
KIT TURNOVER KIT A (KITS) IMPLANT
LINER NEUTRAL 52X36MM PLUS 4 (Liner) ×4 IMPLANT
MARKER SKIN DUAL TIP RULER LAB (MISCELLANEOUS) ×3 IMPLANT
PACK ANTERIOR HIP CUSTOM (KITS) ×3 IMPLANT
PACK UNIVERSAL I (CUSTOM PROCEDURE TRAY) ×2 IMPLANT
PENCIL SMOKE EVACUATOR (MISCELLANEOUS) ×2 IMPLANT
PIN SECTOR W/GRIP ACE CUP 52MM (Hips) ×4 IMPLANT
SET HNDPC FAN SPRY TIP SCT (DISPOSABLE) ×2 IMPLANT
SPONGE LAP 18X18 RF (DISPOSABLE) ×4 IMPLANT
STAPLER VISISTAT 35W (STAPLE) ×4 IMPLANT
STEM FEM ACTIS STD SZ4 (Stem) ×4 IMPLANT
SUT ETHIBOND NAB CT1 #1 30IN (SUTURE) ×6 IMPLANT
SUT MNCRL AB 4-0 PS2 18 (SUTURE) IMPLANT
SUT VIC AB 0 CT1 36 (SUTURE) ×6 IMPLANT
SUT VIC AB 1 CT1 36 (SUTURE) ×6 IMPLANT
SUT VIC AB 2-0 CT1 27 (SUTURE) ×4
SUT VIC AB 2-0 CT1 TAPERPNT 27 (SUTURE) ×2 IMPLANT
TRAY FOLEY MTR SLVR 16FR STAT (SET/KITS/TRAYS/PACK) ×3 IMPLANT
YANKAUER SUCT BULB TIP 10FT TU (MISCELLANEOUS) ×6 IMPLANT

## 2019-03-01 NOTE — Anesthesia Postprocedure Evaluation (Signed)
Anesthesia Post Note  Patient: Samuel Tate  Procedure(s) Performed: BILATERAL ANTERIOR TOTAL HIP ARTHROPLASTY (Bilateral Hip)     Patient location during evaluation: PACU Anesthesia Type: MAC and Spinal Level of consciousness: oriented and awake and alert Pain management: pain level controlled Vital Signs Assessment: post-procedure vital signs reviewed and stable Respiratory status: spontaneous breathing and respiratory function stable Cardiovascular status: blood pressure returned to baseline and stable Postop Assessment: no headache, no backache, no apparent nausea or vomiting and patient able to bend at knees Anesthetic complications: no    Last Vitals:  Vitals:   03/01/19 1515 03/01/19 1530  BP: 114/74 118/72  Pulse: 81 75  Resp: 16 12  Temp:    SpO2: 99% 97%    Last Pain:  Vitals:   03/01/19 1507  TempSrc:   PainSc: [place]

## 2019-03-01 NOTE — Progress Notes (Signed)
PT Cancellation Note  Patient Details Name: Anterrio Mccleery MRN: [id number] DOB: [birth year]   Cancelled Treatment:    Reason Eval/Treat Not Completed: Pain limiting ability to participate(Pt highly motivated to particiapte in therapy and requested PT return after pain meds have taken affect. On follow up equally as motivated however upon attempting to mobilize Lt LE pt grimacing and fighting back tears from pain.) Pt educated on benefits of rest for this night and that PT will return tomorrow to assist him with mobility and progress towards his goals of independence. Acute PT will follow up and progress as able.   Kipp Brood, PT, DPT Physical Therapist with [facility]  03/01/2019 7:17 PM

## 2019-03-01 NOTE — Brief Op Note (Signed)
03/01/2019  2:52 PM  PATIENT:  Samuel Tate  28 y.o. male  PRE-OPERATIVE DIAGNOSIS:  avascular necrosis bilateral hips  POST-OPERATIVE DIAGNOSIS:  avascular necrosis bilateral hips  PROCEDURE:  Procedure(s): BILATERAL ANTERIOR TOTAL HIP ARTHROPLASTY (Bilateral)  SURGEON:  Surgeon(s) and Role:    Mcarthur Rossetti, MD - Primary  PHYSICIAN ASSISTANT: Benita Stabile, PA-C  ANESTHESIA:   spinal  EBL:  400 mL   COUNTS:  YES  DICTATION: .Other Dictation: Dictation Number [id number]  PLAN OF CARE: Admit to inpatient   PATIENT DISPOSITION:  PACU - hemodynamically stable.   Delay start of Pharmacological VTE agent (>24hrs) due to surgical blood loss or risk of bleeding: no

## 2019-03-01 NOTE — Plan of Care (Signed)
  Problem: Education: Goal: Knowledge of the prescribed therapeutic regimen will improve Outcome: Progressing Goal: Understanding of discharge needs will improve Outcome: Progressing   Problem: Activity: Goal: Ability to tolerate increased activity will improve Outcome: Progressing   Problem: Clinical Measurements: Goal: Postoperative complications will be avoided or minimized Outcome: Progressing   Problem: Pain Management: Goal: Pain level will decrease with appropriate interventions Outcome: Progressing   

## 2019-03-01 NOTE — Anesthesia Procedure Notes (Signed)
Procedure Name: MAC Date/Time: 03/01/2019 12:05 PM Performed by: Lissa Morales, CRNA Pre-anesthesia Checklist: Patient identified, Emergency Drugs available, Suction available, Patient being monitored and Timeout performed Patient Re-evaluated:Patient Re-evaluated prior to induction Oxygen Delivery Method: Simple face mask Placement Confirmation: positive ETCO2

## 2019-03-01 NOTE — H&P (Signed)
  The patient is here today for scheduled bilateral total hip arthroplasties to treat his end-stage avascular necrosis.  He has severe pain with both his hips.  We had a long thorough discussion about surgery.  We had a discussion of the risks and benefits of the surgery.  We talked about the intraoperative and postoperative course.  He has had no acute changes in medical status.  See recent H&P.  Question concerns were answered and addressed.  Both hips were marked and informed consent is obtained.

## 2019-03-01 NOTE — Transfer of Care (Signed)
Immediate Anesthesia Transfer of Care Note  Patient: Samuel Tate  Procedure(s) Performed: BILATERAL ANTERIOR TOTAL HIP ARTHROPLASTY (Bilateral Hip)  Patient Location: PACU  Anesthesia Typpe: Spinal  Level of Consciousness: sedated and patient cooperative  Airway & Oxygen Therapy: Patient Spontanous Breathing and Patient connected to face mask oxygen  Post-op Assessment: Report given to RN and Post -op Vital signs reviewed and stable  Post vital signs: stable  Last Vitals:  Vitals Value Taken Time  BP 116/77 03/01/19 1507  Temp    Pulse 79 03/01/19 1513  Resp 14 03/01/19 1513  SpO2 99 % 03/01/19 1513  Vitals shown include unvalidated device data.  Last Pain:  Vitals:   03/01/19 0900  TempSrc: Oral         Complications: No apparent anesthesia complications

## 2019-03-01 NOTE — Op Note (Signed)
NAMEKAELEB, EMOND MEDICAL RECORD [id number] ACCOUNT [id number] DATE OF BIRTH:1990-10-23 FACILITY: [facility] LOCATION: [id number] PHYSICIAN:Adriyana Greenbaum Aretha Parrot, MD  OPERATIVE REPORT  DATE OF PROCEDURE:  03/01/2019  PREOPERATIVE DIAGNOSIS:  Bilateral hip end-stage avascular necrosis.  POSTOPERATIVE DIAGNOSIS:  Bilateral hip end-stage avascular necrosis.  PROCEDURE PERFORMED:  Bilateral total hip arthroplasty through direct anterior approach.  IMPLANTS: 1.  Left hip size 52 DePuy Gription acetabular component, size 36+4 neutral polyethylene liner, size 4 Actis femoral component with standard offset, size 36+5 metal hip ball. 2.  Right hip DePuy Sector Gription acetabular component size 52, size 36+4 neutral polyethylene liner, size 4 Actis femoral component with standard offset, size 36+5 hip ball.  SURGEON:  Louellen Molder, MD  ASSISTANT:  Richardean Canal, PA-C  ANESTHESIA:  Spinal.  ANTIBIOTICS:  Two g IV Ancef.  ESTIMATED BLOOD LOSS:  400 mL.  COMPLICATIONS:  None.  INDICATIONS:  The patient is an only 28 year old gentleman with a BMI of 39, who has bilateral hip end-stage avascular necrosis.  This has been verified under plain films, as well as an MRI of both hips.  His pain is daily.  He walks with significant  Trendelenburg gait and at this point, his hip pain is detrimentally affecting his mobility, his quality of life and his activities of daily living.   Given the severity of his avascular necrosis and impending femoral head collapse, we have recommended  bilateral total hip arthroplasties.  He understands this will be certainly a tougher case given his morbid obesity with a BMI of 39.  His avascular necrosis  I believe is idiopathic.  I had a long and thorough discussion in the office with him about  surgery.  We talked at length in detail about the risk of acute blood loss anemia, nerve or vessel injury, fracture, infection, DVT, dislocation, implant failure.  He  understands all these are heightened given his obesity and we have certainly encouraged  continued weight loss.  He has lost weight as well.  He understands the goals of the surgery are decreased pain, improve mobility and overall improve quality of life.  DESCRIPTION OF PROCEDURE:  After informed consent was obtained and appropriate left and right hips were marked, he was brought to the operating room and sat up on a stretcher where spinal anesthesia was then obtained.  He was laid in the supine position  on the stretcher.  I was able to assess his leg lengths and they are equal bilaterally.  A Foley catheter was placed and traction boots were placed on both his feet.  Next, he was placed supine on the Hana fracture table, the perineal post in place and  both legs in line skeletal traction device and no traction applied.  We started  with the left hip first.  That was prepped and draped with DuraPrep and sterile drapes.  A timeout was called to identify correct patient and correct left hip first.  We  then made an incision just inferior and posterior to the anterior iliac spine and carried this obliquely down the leg.  We dissected down tensor fascia lata muscle.  Tensor fascia was then divided longitudinally to proceed with direct anterior approach  to the hip.  We identified and cauterized circumflex vessels.  I then identified the hip capsule, opened up the hip capsule in an L-type format, finding a very large joint effusion.  We placed curved retractors around the medial and lateral femoral neck  and then made our femoral neck cut with  an oscillating saw just proximal to the lesser trochanter and completed this with an osteotome.  We placed a corkscrew guide in the femoral head and removed the femoral head in its entirety.  There was a big divot  in the femoral head from what I believe is likely femoral acetabular impingement, but also had an obvious impending femoral head collapse, but a wide area of  cartilage just flaked off easily revealing a femoral head consistent with severe end-stage  avascular necrosis.  We then placed a bent Hohmann over the medial acetabular rim and removed remnants of the acetabular labrum and other debris from the hip.  We then began reaming under direct visualization from a size 44 reamer in stepwise increments  up to a size 51 with all reamers under direct visualization, the last reamer under direct fluoroscopy, so we could obtain our depth of reaming, our inclination and anteversion.  I then placed the real DePuy Sector Gription acetabular component size 52  and a 36+4 neutral polyethylene liner for that size acetabular component.  Attention was then turned to the femur.  With the leg externally rotated to 120 degrees, extended and adducted, we were able to place a Mueller retractor medially and a Hohman  retractor behind the greater trochanter, released the lateral joint capsule and used a box-cutting osteotome to enter the femoral canal and a rongeur to lateralize.  We then began broaching using the Actis broaching system from a size zero up to a size  4.  With a size 4 in place, we trialed a standard offset femoral neck and with a 36+1.5 hip ball reduced this in the acetabulum and it was stable.  We felt like though we needed just a little bit more leg length.  We dislocated the hip and removed the  trial components.  I then placed the real standard offset Actis femoral component size 4 and with a 36+5 metal hip ball, reduced this in the acetabulum and then we were pleased with stability, leg length, offset and range of motion.  We did make him a  little longer than his opposite side, but we are proceeding with surgery on the opposite side.  Anesthesia said that he had only 200 mL of blood loss from his left hip, so they were comfortable with us proceeding.  We did irrigate the left hip with  normal saline solution.  We were able to close the joint capsule with  interrupted #1 Ethibond suture.  The tensor fascia was closed with #1 Vicryl, followed by 0 Vicryl to close deep tissue, 2-0 Vicryl to close subcutaneous tissue and interrupted staples  were used to reapproximate the skin.  Xeroform and Aquacel dressing was applied.  We took all the drapes down and reprepped and draped the actual opposite side.  We regowned and gloved as well.  We brought the C-arm in for this opposite side and put a  new drape on it, keeping the ____  stable.  Anesthesia still felt it was appropriate that we proceeded the case with only 200 mL of blood loss from his other side and his starting off hemoglobin of 15.  We then made an incision on the right side,  starting this just distal and posterior to the anterior superior iliac spine and carried this obliquely down the leg.  We did the exact same dissection, getting down to the hip joint just as we did on the left side.  Once we opened up the hip capsule, we  found a very large joint effusion  on this side as well.  We made our femoral neck cut and then removed the femoral head and certainly there was evidence of femoral head collapse with avascular necrosis and flaking of the cartilage.  On the right side,  we prepared the acetabulum the same as we did on the left side with reaming, with the last reamer under direct visualization so we could obtain our depth of reaming, our inclination and anteversion.  Then we placed a similar size 52 acetabular component  on the right side and a 36+4 neutral polyethylene liner.  For preparation of the femur, we did the same, rotating the leg out and bringing it down and under the other leg for our broaching process.  We were able to broach from the side up to a size 4  just like the other side.  We then trialed the 36+15 hip ball and brought the leg back over and up, reducing the pelvis.  Just like the other side, we felt like we needed just a little bit more leg length.   We dislocated the hip on the  right side and  removed the trial components.  We then placed the real size 4 Actis femoral component with standard offset on the right side and the real 36+5 metal hip ball and again reduced this in the acetabulum.  We were pleased with the leg length, offset, range of  motion and stability assessed radiographically and clinically.  We then irrigated the soft tissue with normal saline solution on the right side.  We closed the joint capsule with interrupted #1 Ethibond suture.  The tensor fascia was closed with #1  Vicryl, followed by 0 Vicryl to close deep tissue, 2-0 Vicryl was used to close the subcutaneous tissue and staples were used to reapproximate the skin.  Xeroform and Aquacel dressing was applied on this side.  Total blood loss during the case was 400  mL.  There were no complications noted.  The patient was then taken off the Hana table and taken to the recovery room in stable condition.  All final counts were correct.  There were no complications noted.  Note, Benita Stabile, PA-C, assisted during the  entire case.  His assistance was crucial for facilitating all aspects of this case.  VN/NUANCE  D:03/01/2019 T:03/01/2019 JOB:[id number]

## 2019-03-02 LAB — BASIC METABOLIC PANEL
Anion gap: 10 (ref 5–15)
BUN: 16 mg/dL (ref 6–20)
CO2: 29 mmol/L (ref 22–32)
Calcium: 8.6 mg/dL — ABNORMAL LOW (ref 8.9–10.3)
Chloride: 102 mmol/L (ref 98–111)
Creatinine, Ser: 0.97 mg/dL (ref 0.61–1.24)
GFR calc Af Amer: >60 mL/min (ref >60)
GFR calc non Af Amer: >60 mL/min (ref >60)
Glucose, Bld: 145 mg/dL — ABNORMAL HIGH (ref 70–99)
Potassium: 3.7 mmol/L (ref 3.5–5.1)
Sodium: 141 mmol/L (ref 135–145)

## 2019-03-02 LAB — CBC
HCT: 36.6 % — ABNORMAL LOW (ref 39.0–52.0)
Hemoglobin: 12 g/dL — ABNORMAL LOW (ref 13.0–17.0)
MCH: 31 pg (ref 26.0–34.0)
MCHC: 32.8 g/dL (ref 30.0–36.0)
MCV: 94.6 fL (ref 80.0–100.0)
Platelets: 396 10*3/uL (ref 150–400)
RBC: 3.87 MIL/uL — ABNORMAL LOW (ref 4.22–5.81)
RDW: 12.3 % (ref 11.5–15.5)
WBC: 16 10*3/uL — ABNORMAL HIGH (ref 4.0–10.5)
nRBC: 0 % (ref 0.0–0.2)

## 2019-03-02 NOTE — Progress Notes (Signed)
Physical Therapy Treatment Patient Details Name: Samuel Tate MRN: [id number] DOB: 1990-09-17 Today's Date: 03/02/2019    History of Present Illness Patient is 28 y.o. male s/p Bil THA anterior approach on 03/01/19 with PMH significant for avascular necrosis of bil hips, HTN, depression, and anxiety.    PT Comments    Pt continues motivated and progressing slowly with mobility and with increased time for all tasks   Follow Up Recommendations  Home health PT;Follow surgeon's recommendation for DC plan and follow-up therapies     Equipment Recommendations  Rolling walker with 5" wheels    Recommendations for Other Services OT consult     Precautions / Restrictions Precautions Precautions: Fall Restrictions Weight Bearing Restrictions: No Other Position/Activity Restrictions: WBAT    Mobility  Bed Mobility Overal bed mobility: Needs Assistance Bed Mobility: Sit to Supine     Supine to sit: Mod assist;+2 for physical assistance;+2 for safety/equipment Sit to supine: Mod assist;+2 for physical assistance;+2 for safety/equipment   General bed mobility comments: cues for sequence and use of UEs to self assist.  Increased time 2* pain.   Physical assist to manage bil LEs and to control trunk   Transfers Overall transfer level: Needs assistance Equipment used: Rolling walker (2 wheeled) Transfers: Sit to/from Stand Sit to Stand: Mod assist;+2 physical assistance;+2 safety/equipment;From elevated surface         General transfer comment: cues for LE management and use of UEs to self assist  Ambulation/Gait Ambulation/Gait assistance: Min assist;+2 physical assistance;+2 safety/equipment Gait Distance (Feet): 17 Feet Assistive device: Rolling walker (2 wheeled) Gait Pattern/deviations: Step-to pattern;Step-through pattern;Decreased step length - right;Decreased step length - left;Shuffle;Trunk flexed Gait velocity: decr   General Gait Details: cues for posture, sequence  and position from [employer] Mobility    Modified Rankin (Stroke Patients Only)       Balance Overall balance assessment: Needs assistance Sitting-balance support: Bilateral upper extremity supported;Feet supported Sitting balance-Leahy Scale: Fair     Standing balance support: Bilateral upper extremity supported Standing balance-Leahy Scale: Poor                              Cognition Arousal/Alertness: Awake/alert Behavior During Therapy: WFL for tasks assessed/performed Overall Cognitive Status: Within Functional Limits for tasks assessed                                        Exercises Total Joint Exercises Ankle Circles/Pumps: AROM;Both;15 reps;Supine Heel Slides: AAROM;Both;15 reps;Supine Hip ABduction/ADduction: AAROM;Both;15 reps;Supine    General Comments        Pertinent Vitals/Pain Pain Assessment: 0-10 Pain Score: 6  Pain Location: Bil hips L>R Pain Descriptors / Indicators: Aching;Grimacing;Guarding;Sore Pain Intervention(s): Limited activity within patient's tolerance;Monitored during session;Premedicated before session;Ice applied    Home Living Family/patient expects to be discharged to:: Private residence Living Arrangements: Parent Available Help at Discharge: Family Type of Home: House Home Access: Stairs to enter Entrance Stairs-Rails: Right;Left Home Layout: Able to live on main level with bedroom/bathroom Home Equipment: Crutches      Prior Function Level of Independence: Independent          PT Goals (current goals can now be found in the care plan section) Acute Rehab PT Goals Patient Stated Goal: Regain IND PT  Goal Formulation: With patient Time For Goal Achievement: 03/16/19 Potential to Achieve Goals: Good Progress towards PT goals: Progressing toward goals    Frequency    7X/week      PT Plan Current plan remains appropriate    Co-evaluation               AM-PAC PT "6 Clicks" Mobility   Outcome Measure  Help needed turning from your back to your side while in a flat bed without using bedrails?: A Lot Help needed moving from lying on your back to sitting on the side of a flat bed without using bedrails?: A Lot Help needed moving to and from a bed to a chair (including a wheelchair)?: A Lot Help needed standing up from a chair using your arms (e.g., wheelchair or bedside chair)?: A Lot Help needed to walk in hospital room?: A Lot Help needed climbing 3-5 steps with a railing? : Total 6 Click Score: 11    End of Session Equipment Utilized During Treatment: Gait belt Activity Tolerance: Patient limited by fatigue;Patient limited by pain;Patient tolerated treatment well Patient left: in bed;with call bell/phone within reach;with family/visitor present Nurse Communication: Mobility status PT Visit Diagnosis: Difficulty in walking, not elsewhere classified (R26.2)     Time: 6160-7371 PT Time Calculation (min) (ACUTE ONLY): 16 min  Charges:  $Gait Training: 8-22 mins $Therapeutic Exercise: 8-22 mins                     [place] Pager [phone] Office [phone]    Jahmal Dunavant 03/02/2019, 3:13 PM

## 2019-03-02 NOTE — Evaluation (Signed)
Physical Therapy Evaluation Patient Details Name: Samuel Tate MRN: [id number] DOB: 07/12/1990 Today's Date: 03/02/2019   History of Present Illness  Patient is 28 y.o. male s/p Bil THA anterior approach on 03/01/19 with PMH significant for avascular necrosis of bil hips, HTN, depression, and anxiety.  Clinical Impression  Pt s/p Bil THR and presents with decreased bil LE strength/ROM, post op pain and balance deficits limiting functional mobility.  Pt should progress to dc home with family assist.    Follow Up Recommendations Home health PT;Follow surgeon's recommendation for DC plan and follow-up therapies    Equipment Recommendations  Rolling walker with 5" wheels    Recommendations for Other Services OT consult     Precautions / Restrictions Precautions Precautions: Fall Restrictions Weight Bearing Restrictions: No Other Position/Activity Restrictions: WBAT      Mobility  Bed Mobility Overal bed mobility: Needs Assistance Bed Mobility: Supine to Sit     Supine to sit: Mod assist;+2 for physical assistance;+2 for safety/equipment     General bed mobility comments: cues for sequence and use of UEs to self assist.  Increased time 2* pain.   Physical assist to manage bil LEs and to bring trunk to upright position  Transfers Overall transfer level: Needs assistance Equipment used: Rolling walker (2 wheeled) Transfers: Sit to/from Stand Sit to Stand: Mod assist;+2 physical assistance;+2 safety/equipment;From elevated surface         General transfer comment: cues for LE management and use of UEs to self assist  Ambulation/Gait Ambulation/Gait assistance: Min assist;Mod assist;+2 physical assistance;+2 safety/equipment Gait Distance (Feet): 12 Feet Assistive device: Rolling walker (2 wheeled) Gait Pattern/deviations: Step-to pattern;Step-through pattern;Decreased step length - right;Decreased step length - left;Shuffle;Trunk flexed Gait velocity: decr   General Gait  Details: cues for posture, sequence and position from [employer] Mobility    Modified Rankin (Stroke Patients Only)       Balance Overall balance assessment: Needs assistance Sitting-balance support: Bilateral upper extremity supported;Feet supported Sitting balance-Leahy Scale: Fair     Standing balance support: Bilateral upper extremity supported Standing balance-Leahy Scale: Poor                               Pertinent Vitals/Pain Pain Assessment: 0-10 Pain Score: 7  Pain Location: Bil hips L>R Pain Descriptors / Indicators: Aching;Grimacing;Guarding;Sore Pain Intervention(s): Limited activity within patient's tolerance;Monitored during session;Premedicated before session;Ice applied    Home Living Family/patient expects to be discharged to:: Private residence Living Arrangements: Parent Available Help at Discharge: Family Type of Home: House Home Access: Stairs to enter Entrance Stairs-Rails: Doctor, general practice of Steps: 2 Home Layout: Able to live on main level with bedroom/bathroom Home Equipment: Crutches      Prior Function Level of Independence: Independent               Hand Dominance        Extremity/Trunk Assessment   Upper Extremity Assessment Upper Extremity Assessment: Overall WFL for tasks assessed    Lower Extremity Assessment Lower Extremity Assessment: RLE deficits/detail;LLE deficits/detail RLE Deficits / Details: Strength at hip 2/5 with AAROM at hip to 80 flex and 15 abd LLE Deficits / Details: Strength at hip 2/5 with AAROM at hip to 75 flex and 15 abd    Cervical / Trunk Assessment Cervical / Trunk Assessment: Normal  Communication   Communication: No difficulties  Cognition  Arousal/Alertness: Awake/alert Behavior During Therapy: WFL for tasks assessed/performed Overall Cognitive Status: Within Functional Limits for tasks assessed                                         General Comments      Exercises Total Joint Exercises Ankle Circles/Pumps: AROM;Both;15 reps;Supine Heel Slides: AAROM;Both;15 reps;Supine Hip ABduction/ADduction: AAROM;Both;15 reps;Supine   Assessment/Plan    PT Assessment Patient needs continued PT services  PT Problem List Decreased strength;Decreased range of motion;Decreased activity tolerance;Decreased balance;Decreased mobility;Decreased knowledge of use of DME;Pain;Obesity       PT Treatment Interventions DME instruction;Gait training;Stair training;Functional mobility training;Therapeutic activities;Therapeutic exercise;Patient/family education    PT Goals (Current goals can be found in the Care Plan section)  Acute Rehab PT Goals Patient Stated Goal: Regain IND PT Goal Formulation: With patient Time For Goal Achievement: 03/16/19 Potential to Achieve Goals: Good    Frequency 7X/week   Barriers to discharge        Co-evaluation               AM-PAC PT "6 Clicks" Mobility  Outcome Measure Help needed turning from your back to your side while in a flat bed without using bedrails?: A Lot Help needed moving from lying on your back to sitting on the side of a flat bed without using bedrails?: A Lot Help needed moving to and from a bed to a chair (including a wheelchair)?: A Lot Help needed standing up from a chair using your arms (e.g., wheelchair or bedside chair)?: A Lot Help needed to walk in hospital room?: A Lot Help needed climbing 3-5 steps with a railing? : Total 6 Click Score: 11    End of Session Equipment Utilized During Treatment: Gait belt Activity Tolerance: Patient limited by fatigue;Patient limited by pain;Patient tolerated treatment well Patient left: in chair;with call bell/phone within reach;with chair alarm set Nurse Communication: Mobility status PT Visit Diagnosis: Difficulty in walking, not elsewhere classified (R26.2)    Time: 1884-1660 PT Time Calculation  (min) (ACUTE ONLY): 38 min   Charges:   PT Evaluation $PT Eval Low Complexity: 1 Low PT Treatments $Gait Training: 8-22 mins $Therapeutic Exercise: 8-22 mins        Debe Coder PT Acute Rehabilitation Services Pager [phone] Office [phone]   Chrisean Kloth 03/02/2019, 3:08 PM

## 2019-03-02 NOTE — Progress Notes (Signed)
Subjective: 1 Day Post-Op Procedure(s) (LRB): BILATERAL ANTERIOR TOTAL HIP ARTHROPLASTY (Bilateral) Patient reports pain as moderate.  Nausea.  Working with therapy  Objective: Vital signs in last 24 hours: Temp:  [98.1 F (36.7 C)-98.4 F (36.9 C)] 98.3 F (36.8 C) (12/12 0615) Pulse Rate:  [68-90] 90 (12/12 0615) Resp:  [12-17] 16 (12/12 0615) BP: (113-148)/(72-109) 120/91 (12/12 0615) SpO2:  [96 %-99 %] 97 % (12/12 0615) Weight:  [111.6 kg] 111.6 kg (12/11 1653)  Intake/Output from previous day: 12/11 0701 - 12/12 0700 In: 3818.7 [P.O.:360; I.V.:3258.7; IV Piggyback:200] Out: 1970 [Urine:1570; Blood:400] Intake/Output this shift: No intake/output data recorded.  Recent Labs    02/27/19 1100 03/02/19 0223  HGB 15.8 12.0*   Recent Labs    02/27/19 1100 03/02/19 0223  WBC 10.0 16.0*  RBC 5.16 3.87*  HCT 48.4 36.6*  PLT 483* 396   Recent Labs    02/27/19 1100 03/02/19 0223  NA 139 141  K 3.9 3.7  CL 101 102  CO2 26 29  BUN 14 16  CREATININE 1.01 0.97  GLUCOSE 97 145*  CALCIUM 9.4 8.6*   No results for input(s): LABPT, INR in the last 72 hours.  Sensation intact distally Intact pulses distally Dorsiflexion/Plantar flexion intact Incision: scant drainage   Assessment/Plan: 1 Day Post-Op Procedure(s) (LRB): BILATERAL ANTERIOR TOTAL HIP ARTHROPLASTY (Bilateral) Up with therapy Discharge home with home health next few days as he recovers from bilateral hip replacements.      Mcarthur Rossetti 03/02/2019, 10:03 AM

## 2019-03-02 NOTE — Discharge Instructions (Signed)

## 2019-03-02 NOTE — TOC Progression Note (Signed)
Transition of Care [facility]) - Progression Note    Patient Details  Name: Samuel Tate MRN: [id number] Date of Birth: 1990-11-16  [facility] CM/SW Contact  Joaquin Courts, RN Phone Number: 03/02/2019, 12:02 PM  Clinical Narrative:    CM spoke with patient at bedside. Patient set up with [facility] for [place]. [employer] to deliver rolling walker and 3-in-1 to bedside for home use.     Expected Discharge Plan: [place] Barriers to Discharge: Continued Medical Work up  Expected Discharge Plan and Services Expected Discharge Plan: [place]   Discharge Planning Services: CM Consult Post Acute Care Choice: [place] arrangements for the past 2 months: Single Family Home                 DME Arranged: Walker rolling, 3-N-1 DME Agency: [employer] Date DME Agency Contacted: 03/02/19 Time DME Agency Contacted: 58 Representative spoke with at DME Agency: Jeneen Rinks HH Arranged: PT [place]: [facility] (formerly [employer]) Date [place]: 03/02/19 Time [place]: 65 Representative spoke with at [place]: pre arranged in MD office   Social Determinants of Health ([place]) Interventions    Readmission Risk Interventions No flowsheet data found.

## 2019-03-03 NOTE — Progress Notes (Signed)
Physical Therapy Treatment Patient Details Name: Samuel Tate MRN: [id number] DOB: Nov 11, 1990 Today's Date: 03/03/2019    History of Present Illness Patient is 28 y.o. male s/p Bil THA anterior approach on 03/01/19 with PMH significant for avascular necrosis of bil hips, HTN, depression, and anxiety.    PT Comments    Pt performed therex program with assist.  Will return shortly with OT for OOB.   Follow Up Recommendations  Home health PT;Follow surgeon's recommendation for DC plan and follow-up therapies     Equipment Recommendations  Rolling walker with 5" wheels    Recommendations for Other Services OT consult     Precautions / Restrictions Precautions Precautions: Fall Restrictions Weight Bearing Restrictions: No Other Position/Activity Restrictions: WBAT    Mobility  Bed Mobility Overal bed mobility: Needs Assistance Bed Mobility: Supine to Sit     Supine to sit: Mod assist;+2 for safety/equipment;HOB elevated     General bed mobility comments: Assist to advance BLE across bed and into full EOB position. Pt utilizing rail and BUEs onto mattress to power trunk up and into full EOB position. extra time and effort, rest breaks due to pain level.   Transfers Overall transfer level: Needs assistance Equipment used: Rolling walker (2 wheeled) Transfers: Sit to/from Stand Sit to Stand: Mod assist;+2 physical assistance;+2 safety/equipment;From elevated surface         General transfer comment: assist to steady and stabilize rw. cues for technique. Stood briefly on first attempt, seated rest break, then walked 5' on second stand. Steadying assist and cues for technique for stand>sit in recliner.   Ambulation/Gait Ambulation/Gait assistance: Min assist;+2 physical assistance;+2 safety/equipment Gait Distance (Feet): 5 Feet Assistive device: Rolling walker (2 wheeled) Gait Pattern/deviations: Step-to pattern;Step-through pattern;Decreased step length - right;Decreased  step length - left;Shuffle;Trunk flexed Gait velocity: decr   General Gait Details: cues for posture, sequence and position from RW; distance ltd by pain   Stairs             Wheelchair Mobility    Modified Rankin (Stroke Patients Only)       Balance Overall balance assessment: Needs assistance Sitting-balance support: Bilateral upper extremity supported;Feet supported Sitting balance-Leahy Scale: Fair     Standing balance support: Bilateral upper extremity supported Standing balance-Leahy Scale: Poor                              Cognition Arousal/Alertness: Awake/alert Behavior During Therapy: WFL for tasks assessed/performed Overall Cognitive Status: Within Functional Limits for tasks assessed                                        Exercises Total Joint Exercises Ankle Circles/Pumps: AROM;Both;15 reps;Supine Quad Sets: AROM;Both;10 reps;Supine Heel Slides: AAROM;Both;15 reps;Supine Hip ABduction/ADduction: AAROM;Both;15 reps;Supine    General Comments        Pertinent Vitals/Pain Pain Assessment: 0-10 Pain Score: 9  Pain Location: Bil hips Pain Descriptors / Indicators: Crying;Guarding;Aching;Sore Pain Intervention(s): Limited activity within patient's tolerance;Monitored during session;Premedicated before session;Patient requesting pain meds-RN notified;Ice applied    Home Living                      Prior Function            PT Goals (current goals can now be found in the care plan section) Acute Rehab PT Goals  Patient Stated Goal: Regain IND PT Goal Formulation: With patient Time For Goal Achievement: 03/16/19 Potential to Achieve Goals: Good Progress towards PT goals: Not progressing toward goals - comment(increased pain with WB)    Frequency    7X/week      PT Plan Current plan remains appropriate    Co-evaluation PT/OT/SLP Co-Evaluation/Treatment: Yes Reason for Co-Treatment: For  patient/therapist safety PT goals addressed during session: Mobility/safety with mobility OT goals addressed during session: ADL's and self-care      AM-PAC PT "6 Clicks" Mobility   Outcome Measure  Help needed turning from your back to your side while in a flat bed without using bedrails?: A Lot Help needed moving from lying on your back to sitting on the side of a flat bed without using bedrails?: A Lot Help needed moving to and from a bed to a chair (including a wheelchair)?: A Lot Help needed standing up from a chair using your arms (e.g., wheelchair or bedside chair)?: A Lot Help needed to walk in hospital room?: A Lot Help needed climbing 3-5 steps with a railing? : Total 6 Click Score: 11    End of Session Equipment Utilized During Treatment: Gait belt Activity Tolerance: Patient limited by fatigue;Patient limited by pain Patient left: in chair;with call bell/phone within reach;with chair alarm set Nurse Communication: Mobility status PT Visit Diagnosis: Difficulty in walking, not elsewhere classified (R26.2)     Time: 7408-1448 PT Time Calculation (min) (ACUTE ONLY): 23 min  Charges:  $Gait Training: 8-22 mins $Therapeutic Exercise: 23-37 mins                     Mauro Kaufmann PT Acute Rehabilitation Services Pager [phone] Office [phone]    Kolter Reaver 03/03/2019, 1:14 PM

## 2019-03-03 NOTE — Progress Notes (Signed)
Physical Therapy Treatment Patient Details Name: Samuel Tate MRN: [id number] DOB: [birth year] Today's Date: 03/03/2019    History of Present Illness Patient is 28 y.o. male s/p Bil THA anterior approach on 03/01/19 with PMH significant for avascular necrosis of bil hips, HTN, depression, and anxiety.    PT Comments    Pt with marked improvement in activity tolerance this pm vs am session but with c/o dizziness with ambulation - BP 118/75 with HR 129.   Follow Up Recommendations  Home health PT;Follow surgeon's recommendation for DC plan and follow-up therapies     Equipment Recommendations  Rolling walker with 5" wheels    Recommendations for Other Services OT consult     Precautions / Restrictions Precautions Precautions: Fall Restrictions Weight Bearing Restrictions: No Other Position/Activity Restrictions: WBAT    Mobility  Bed Mobility Overal bed mobility: Needs Assistance Bed Mobility: Sit to Supine       Sit to supine: Mod assist   General bed mobility comments: cues for sequence with assist to manage Bil LEs  Transfers Overall transfer level: Needs assistance Equipment used: Rolling walker (2 wheeled) Transfers: Sit to/from Stand Sit to Stand: Min assist;Mod assist;+2 safety/equipment         General transfer comment: cues for LE management and use of UEs to self assist.  Ambulation/Gait Ambulation/Gait assistance: Min assist;+2 physical assistance;+2 safety/equipment Gait Distance (Feet): 42 Feet Assistive device: Rolling walker (2 wheeled) Gait Pattern/deviations: Step-to pattern;Step-through pattern;Decreased step length - right;Decreased step length - left;Shuffle;Trunk flexed Gait velocity: decr   General Gait Details: cues for posture, sequence and position from RW; c/o dizziness - BP 118/75   Stairs             Wheelchair Mobility    Modified Rankin (Stroke Patients Only)       Balance Overall balance assessment: Needs  assistance Sitting-balance support: Bilateral upper extremity supported;Feet supported Sitting balance-Leahy Scale: Fair     Standing balance support: Single extremity supported Standing balance-Leahy Scale: Fair Standing balance comment: Pt able to adjust face mask in standing                            Cognition Arousal/Alertness: Awake/alert Behavior During Therapy: WFL for tasks assessed/performed Overall Cognitive Status: Within Functional Limits for tasks assessed                                        Exercises      General Comments        Pertinent Vitals/Pain Pain Assessment: 0-10 Pain Score: 7  Pain Location: Bil hips Pain Descriptors / Indicators: Aching;Sore;Grimacing;Guarding Pain Intervention(s): Limited activity within patient's tolerance;Monitored during session;Premedicated before session;Ice applied    Home Living                      Prior Function            PT Goals (current goals can now be found in the care plan section) Acute Rehab PT Goals Patient Stated Goal: Regain IND PT Goal Formulation: With patient Time For Goal Achievement: 03/16/19 Potential to Achieve Goals: Good Progress towards PT goals: Progressing toward goals    Frequency    7X/week      PT Plan Current plan remains appropriate    Co-evaluation  AM-PAC PT "6 Clicks" Mobility   Outcome Measure  Help needed turning from your back to your side while in a flat bed without using bedrails?: A Lot Help needed moving from lying on your back to sitting on the side of a flat bed without using bedrails?: A Lot Help needed moving to and from a bed to a chair (including a wheelchair)?: A Lot Help needed standing up from a chair using your arms (e.g., wheelchair or bedside chair)?: A Lot Help needed to walk in hospital room?: A Lot Help needed climbing 3-5 steps with a railing? : Total 6 Click Score: 11    End of Session  Equipment Utilized During Treatment: Gait belt Activity Tolerance: Patient tolerated treatment well;Patient limited by fatigue Patient left: in bed;with call bell/phone within reach;with family/visitor present Nurse Communication: Mobility status PT Visit Diagnosis: Difficulty in walking, not elsewhere classified (R26.2)     Time: 9892-1194 PT Time Calculation (min) (ACUTE ONLY): 27 min  Charges:  $Gait Training: 23-37 mins                     Mauro Kaufmann PT Acute Rehabilitation Services Pager [phone] Office [phone]    Samuel Tate 03/03/2019, 3:32 PM

## 2019-03-03 NOTE — Progress Notes (Signed)
Physical Therapy Treatment Patient Details Name: Samuel Tate MRN: [id number] DOB: 12-22-1990 Today's Date: 03/03/2019    History of Present Illness Patient is 28 y.o. male s/p Bil THA anterior approach on 03/01/19 with PMH significant for avascular necrosis of bil hips, HTN, depression, and anxiety.    PT Comments    Pt continues cooperative but ambulation ltd this am by increased pain with WB.  Follow Up Recommendations  Home health PT;Follow surgeon's recommendation for DC plan and follow-up therapies     Equipment Recommendations  Rolling walker with 5" wheels    Recommendations for Other Services OT consult     Precautions / Restrictions Precautions Precautions: Fall Restrictions Weight Bearing Restrictions: No Other Position/Activity Restrictions: WBAT    Mobility  Bed Mobility Overal bed mobility: Needs Assistance Bed Mobility: Supine to Sit     Supine to sit: Mod assist;+2 for safety/equipment;HOB elevated     General bed mobility comments: Assist to advance BLE across bed and into full EOB position. Pt utilizing rail and BUEs onto mattress to power trunk up and into full EOB position. extra time and effort, rest breaks due to pain level.   Transfers Overall transfer level: Needs assistance Equipment used: Rolling walker (2 wheeled) Transfers: Sit to/from Stand Sit to Stand: Mod assist;+2 physical assistance;+2 safety/equipment;From elevated surface         General transfer comment: assist to steady and stabilize rw. cues for technique. Stood briefly on first attempt, seated rest break, then walked 5' on second stand. Steadying assist and cues for technique for stand>sit in recliner.   Ambulation/Gait Ambulation/Gait assistance: Min assist;+2 physical assistance;+2 safety/equipment Gait Distance (Feet): 5 Feet Assistive device: Rolling walker (2 wheeled) Gait Pattern/deviations: Step-to pattern;Step-through pattern;Decreased step length - right;Decreased  step length - left;Shuffle;Trunk flexed Gait velocity: decr   General Gait Details: cues for posture, sequence and position from RW; distance ltd by pain   Stairs             Wheelchair Mobility    Modified Rankin (Stroke Patients Only)       Balance Overall balance assessment: Needs assistance Sitting-balance support: Bilateral upper extremity supported;Feet supported Sitting balance-Leahy Scale: Fair     Standing balance support: Bilateral upper extremity supported Standing balance-Leahy Scale: Poor                              Cognition Arousal/Alertness: Awake/alert Behavior During Therapy: WFL for tasks assessed/performed Overall Cognitive Status: Within Functional Limits for tasks assessed                                        Exercises      General Comments        Pertinent Vitals/Pain Pain Assessment: 0-10 Pain Score: 9  Pain Location: Bil hips Pain Descriptors / Indicators: Crying;Guarding;Aching;Sore Pain Intervention(s): Limited activity within patient's tolerance;Monitored during session;Premedicated before session;Patient requesting pain meds-RN notified;Ice applied    Home Living                      Prior Function            PT Goals (current goals can now be found in the care plan section) Acute Rehab PT Goals Patient Stated Goal: Regain IND PT Goal Formulation: With patient Time For Goal Achievement: 03/16/19 Potential to Achieve Goals:  Good Progress towards PT goals: Not progressing toward goals - comment(increased pain with WB)    Frequency    7X/week      PT Plan Current plan remains appropriate    Co-evaluation PT/OT/SLP Co-Evaluation/Treatment: Yes Reason for Co-Treatment: For patient/therapist safety PT goals addressed during session: Mobility/safety with mobility OT goals addressed during session: ADL's and self-care      AM-PAC PT "6 Clicks" Mobility   Outcome Measure   Help needed turning from your back to your side while in a flat bed without using bedrails?: A Lot Help needed moving from lying on your back to sitting on the side of a flat bed without using bedrails?: A Lot Help needed moving to and from a bed to a chair (including a wheelchair)?: A Lot Help needed standing up from a chair using your arms (e.g., wheelchair or bedside chair)?: A Lot Help needed to walk in hospital room?: A Lot Help needed climbing 3-5 steps with a railing? : Total 6 Click Score: 11    End of Session Equipment Utilized During Treatment: Gait belt Activity Tolerance: Patient limited by fatigue;Patient limited by pain Patient left: in chair;with call bell/phone within reach;with chair alarm set Nurse Communication: Mobility status PT Visit Diagnosis: Difficulty in walking, not elsewhere classified (R26.2)     Time: 1308-6578 PT Time Calculation (min) (ACUTE ONLY): 23 min  Charges:  $Gait Training: 8-22 mins                     Samuel Tate PT Acute Rehabilitation Services Pager [phone] Office [phone]    Samuel Tate 03/03/2019, 1:11 PM

## 2019-03-03 NOTE — Evaluation (Signed)
Occupational Therapy Evaluation Patient Details Name: Samuel Tate MRN: [id number] DOB: 1990-12-25 Today's Date: 03/03/2019    History of Present Illness Patient is 28 y.o. male s/p Bil THA anterior approach on 03/01/19 with PMH significant for avascular necrosis of bil hips, HTN, depression, and anxiety.   Clinical Impression   Pt admitted with the above diagnoses and presents with below problem list. Pt will benefit from continued acute OT to address the below listed deficits and maximize independence with basic ADLs prior to d/c to venue below. At baseline, pt is independent with ADLs. Pt is currently mod-max A +2 physical/ s/e with LB ADLs and functional transfers and mobility. Pt limited by pain this session, FACES 10/10 bil hips. Nursing notified. Pt up to recliner at end of session.      Follow Up Recommendations  Home health OT;Supervision/Assistance - 24 hour    Equipment Recommendations  3 in 1 bedside commode;Tub/shower bench    Recommendations for Other Services       Precautions / Restrictions Precautions Precautions: Fall Restrictions Other Position/Activity Restrictions: WBAT      Mobility Bed Mobility Overal bed mobility: Needs Assistance Bed Mobility: Supine to Sit     Supine to sit: Mod assist;+2 for safety/equipment;HOB elevated     General bed mobility comments: Assist to advance BLE across bed and into full EOB position. Pt utilizing rail and BUEs onto mattress to power trunk up and into full EOB position. extra time and effort, rest breaks due to pain level.   Transfers Overall transfer level: Needs assistance Equipment used: Rolling walker (2 wheeled) Transfers: Sit to/from Stand Sit to Stand: Mod assist;+2 physical assistance;+2 safety/equipment;From elevated surface         General transfer comment: assist to steady and stabilize rw. cues for technique. Stood briefly on first attempt, seated rest break, then walked 5' on second stand. Steadying  assist and cues for technique for stand>sit in recliner.     Balance Overall balance assessment: Needs assistance Sitting-balance support: Bilateral upper extremity supported;Feet supported Sitting balance-Leahy Scale: Fair     Standing balance support: Bilateral upper extremity supported Standing balance-Leahy Scale: Poor                             ADL either performed or assessed with clinical judgement   ADL Overall ADL's : Needs assistance/impaired Eating/Feeding: Set up;Sitting   Grooming: Set up;Sitting   Upper Body Bathing: Set up;Sitting   Lower Body Bathing: Maximal assistance;+2 for physical assistance;Sit to/from stand;+2 for safety/equipment   Upper Body Dressing : Set up;Sitting   Lower Body Dressing: Maximal assistance;+2 for physical assistance;+2 for safety/equipment;Sit to/from stand   Toilet Transfer: Moderate assistance;+2 for physical assistance;+2 for safety/equipment;Stand-pivot;BSC;RW Toilet Transfer Details (indicate cue type and reason): ambulated in straight line about 5' this session Toileting- Clothing Manipulation and Hygiene: Moderate assistance;Maximal assistance;+2 for physical assistance;+2 for safety/equipment;Sit to/from stand   Tub/ Shower Transfer: Maximal assistance;Moderate assistance;Tub transfer;+2 for physical assistance;+2 for safety/equipment;Stand-pivot;Tub bench;Rolling walker Tub/Shower Transfer Details (indicate cue type and reason): clinical judgement Functional mobility during ADLs: Moderate assistance;+2 for physical assistance;+2 for safety/equipment;Rolling walker General ADL Comments: Pt completed bed mobility, sat EOB a few mintues then walked about 5'. Recliner brought to pt. Extra time and effort due to pain level. Physical assist with bed mobility, transfers, and short distance mobility     Vision         Perception     Praxis  Pertinent Vitals/Pain Pain Assessment: Faces Faces Pain Scale: Hurts  worst Pain Location: Bil hips Pain Descriptors / Indicators: Crying;Guarding;Aching;Sore Pain Intervention(s): Monitored during session;Limited activity within patient's tolerance;Repositioned;Patient requesting pain meds-RN notified;Ice applied;Utilized relaxation techniques     Hand Dominance     Extremity/Trunk Assessment Upper Extremity Assessment Upper Extremity Assessment: Overall WFL for tasks assessed;RUE deficits/detail RUE Deficits / Details: Pt reports h/o intermittent R shoulder pain with shoulder flexion/ABD >80*   Lower Extremity Assessment Lower Extremity Assessment: Defer to PT evaluation   Cervical / Trunk Assessment Cervical / Trunk Assessment: Normal   Communication Communication Communication: No difficulties   Cognition Arousal/Alertness: Awake/alert Behavior During Therapy: WFL for tasks assessed/performed Overall Cognitive Status: Within Functional Limits for tasks assessed                                     General Comments       Exercises     Shoulder Instructions      Home Living Family/patient expects to be discharged to:: Private residence Living Arrangements: Parent Available Help at Discharge: Family Type of Home: House Home Access: Stairs to enter Technical brewer of Steps: 2 Entrance Stairs-Rails: Right;Left Home Layout: Able to live on main level with bedroom/bathroom     Bathroom Shower/Tub: Teacher, early years/pre: Standard     Home Equipment: Crutches;Bedside commode          Prior Functioning/Environment Level of Independence: Independent                 OT Problem List: Decreased activity tolerance;Impaired balance (sitting and/or standing);Decreased knowledge of use of DME or AE;Decreased knowledge of precautions;Pain      OT Treatment/Interventions: Self-care/ADL training;DME and/or AE instruction;Therapeutic activities;Patient/family education;Balance training    OT  Goals(Current goals can be found in the care plan section) Acute Rehab OT Goals Patient Stated Goal: Regain IND OT Goal Formulation: With patient Time For Goal Achievement: 03/17/19 Potential to Achieve Goals: Good ADL Goals Pt Will Perform Lower Body Bathing: sit to/from stand;with supervision Pt Will Perform Lower Body Dressing: with supervision;sit to/from stand Pt Will Transfer to Toilet: with supervision;ambulating Pt Will Perform Toileting - Clothing Manipulation and hygiene: with supervision;sit to/from stand;sitting/lateral leans Pt Will Perform Tub/Shower Transfer: with supervision;ambulating;Tub transfer;tub bench;rolling walker Additional ADL Goal #1: Pt will complete bed mobility at supervision level to prepare for OOB ADLs.  OT Frequency: Min 2X/week   Barriers to D/C:            Co-evaluation PT/OT/SLP Co-Evaluation/Treatment: Yes Reason for Co-Treatment: For patient/therapist safety;To address functional/ADL transfers   OT goals addressed during session: ADL's and self-care      AM-PAC OT "6 Clicks" Daily Activity     Outcome Measure Help from another person eating meals?: None Help from another person taking care of personal grooming?: A Little Help from another person toileting, which includes using toliet, bedpan, or urinal?: A Lot Help from another person bathing (including washing, rinsing, drying)?: A Lot Help from another person to put on and taking off regular upper body clothing?: A Little Help from another person to put on and taking off regular lower body clothing?: A Lot 6 Click Score: 16   End of Session Equipment Utilized During Treatment: Gait belt;Rolling walker Nurse Communication: Mobility status;Other (comment)(Pt with 10/10 FACES Bil hip pain at end of session)  Activity Tolerance: Patient limited by pain Patient left: in  chair;with call bell/phone within reach  OT Visit Diagnosis: Unsteadiness on feet (R26.81);Pain                Time:  [id number] OT Time Calculation (min): 27 min Charges:  OT General Charges $OT Visit: 1 Visit OT Evaluation $OT Eval Moderate Complexity: 1 Mod  Raynald KempKathryn Silvanna Ohmer, OT Acute Rehabilitation Services Pager: [phone] Office: [phone]   Pilar GrammesMathews, Johngabriel Verde H 03/03/2019, 10:09 AM

## 2019-03-04 ENCOUNTER — Encounter: Payer: Self-pay | Admitting: *Deleted

## 2019-03-04 NOTE — Progress Notes (Signed)
Subjective: 3 Days Post-Op Procedure(s) (LRB): BILATERAL ANTERIOR TOTAL HIP ARTHROPLASTY (Bilateral) Patient reports pain as moderate.    Objective: Vital signs in last 24 hours: Temp:  [98.6 F (37 C)-99.6 F (37.6 C)] 99.6 F (37.6 C) (12/14 0511) Pulse Rate:  [102-110] 102 (12/14 0511) Resp:  [18] 18 (12/14 0511) BP: (114-123)/(57-70) 123/70 (12/14 0511) SpO2:  [94 %-97 %] 97 % (12/14 0511)  Intake/Output from previous day: 12/13 0701 - 12/14 0700 In: 600 [P.O.:600] Out: 1200 [Urine:1200] Intake/Output this shift: No intake/output data recorded.  Recent Labs    03/02/19 0223  HGB 12.0*   Recent Labs    03/02/19 0223  WBC 16.0*  RBC 3.87*  HCT 36.6*  PLT 396   Recent Labs    03/02/19 0223  NA 141  K 3.7  CL 102  CO2 29  BUN 16  CREATININE 0.97  GLUCOSE 145*  CALCIUM 8.6*   No results for input(s): LABPT, INR in the last 72 hours.  Sensation intact distally Intact pulses distally Dorsiflexion/Plantar flexion intact Incision: dressing C/D/I   Assessment/Plan: 3 Days Post-Op Procedure(s) (LRB): BILATERAL ANTERIOR TOTAL HIP ARTHROPLASTY (Bilateral) Up with therapy Plan for discharge tomorrow Discharge home with home health      Samuel Tate 03/04/2019, 7:20 AM

## 2019-03-04 NOTE — Progress Notes (Signed)
Physical Therapy Treatment Patient Details Name: Samuel Tate MRN: [id number] DOB: September 13, 1990 Today's Date: 03/04/2019    History of Present Illness Patient is 28 y.o. male s/p Bil THA anterior approach on 03/01/19 with PMH significant for avascular necrosis of bil hips, HTN, depression, and anxiety.    PT Comments    Pt progressing toward PT goals. Very sleepy this afternoon d/t pain meds. Reports he stayed up in chair until 3:00. Agreeable to exercises, L hip slightly more painful than R,  More difficulty with L hip flexion.  Will hopefully be ready for d/c tomorrow.  Continue PT POC  Follow Up Recommendations  Home health PT;Follow surgeon's recommendation for DC plan and follow-up therapies     Equipment Recommendations  Rolling walker with 5" wheels    Recommendations for Other Services       Precautions / Restrictions Precautions Precautions: Fall Restrictions Weight Bearing Restrictions: No Other Position/Activity Restrictions: WBAT    Mobility  Bed Mobility               General bed mobility comments: pt declined OOB d/t being extremely sleepy  Transfers     Transfers: Sit to/from Stand           General transfer comment: assist to steady and stabilize RW. cues for technique  Ambulation/Gait                 Stairs             Wheelchair Mobility    Modified Rankin (Stroke Patients Only)       Balance                                            Cognition Arousal/Alertness: Lethargic;Suspect due to medications Behavior During Therapy: [facility] for tasks assessed/performed Overall Cognitive Status: Within Functional Limits for tasks assessed                                 General Comments: very sleepy d/t pain meds      Exercises Total Joint Exercises Ankle Circles/Pumps: AROM;Both;15 reps;Supine Quad Sets: AROM;Both;10 reps;Supine Gluteal Sets: AROM;Both;10 reps Short Arc Quad:  AROM;AAROM;Both;10 reps Heel Slides: AAROM;Both;15 reps;Supine Hip ABduction/ADduction: AAROM;Both;10 reps    General Comments        Pertinent Vitals/Pain Pain Assessment: 0-10 Pain Score: 5  Pain Location: Bil hips Pain Descriptors / Indicators: Guarding;Aching;Sore Pain Intervention(s): Limited activity within patient's tolerance;Monitored during session;Premedicated before session;Repositioned    Home Living                      Prior Function            PT Goals (current goals can now be found in the care plan section) Acute Rehab PT Goals Patient Stated Goal: Regain IND PT Goal Formulation: With patient Time For Goal Achievement: 03/16/19 Potential to Achieve Goals: Good Progress towards PT goals: Progressing toward goals    Frequency    7X/week      PT Plan Current plan remains appropriate    Co-evaluation              AM-PAC PT "6 Clicks" Mobility   Outcome Measure  Help needed turning from your back to your side while in a flat bed without using bedrails?: A Lot  Help needed moving from lying on your back to sitting on the side of a flat bed without using bedrails?: A Little Help needed moving to and from a bed to a chair (including a wheelchair)?: A Little Help needed standing up from a chair using your arms (e.g., wheelchair or bedside chair)?: A Little Help needed to walk in hospital room?: A Little Help needed climbing 3-5 steps with a railing? : A Lot 6 Click Score: 16    End of Session Equipment Utilized During Treatment: Gait belt Activity Tolerance: Patient limited by fatigue Patient left: in bed;with call bell/phone within reach;with bed alarm set;with family/visitor present Nurse Communication: Mobility status PT Visit Diagnosis: Difficulty in walking, not elsewhere classified (R26.2)     Time: 1941-7408 PT Time Calculation (min) (ACUTE ONLY): 21 min  Charges:  $Therapeutic Exercise: 8-22 mins                      Samuel Tate, PT   Acute Rehab Dept [facility]): [phone]   03/04/2019'    Cassia 03/04/2019, 5:29 PM

## 2019-03-04 NOTE — Progress Notes (Signed)
Physical Therapy Treatment Patient Details Name: Samuel Tate MRN: [id number] DOB: 08/16/1990 Today's Date: 03/04/2019    History of Present Illness Patient is 29 y.o. male s/p Bil THA anterior approach on 03/01/19 with PMH significant for avascular necrosis of bil hips, HTN, depression, and anxiety.    PT Comments    Pt progressing toward PT goals, no dizziness today. Hopeful for d/c tomorrow. Continue PT POC  Follow Up Recommendations  Home health PT;Follow surgeon's recommendation for DC plan and follow-up therapies     Equipment Recommendations  Rolling walker with 5" wheels    Recommendations for Other Services       Precautions / Restrictions Precautions Precautions: Fall Restrictions Weight Bearing Restrictions: No Other Position/Activity Restrictions: WBAT    Mobility  Bed Mobility Overal bed mobility: Needs Assistance Bed Mobility: Supine to Sit     Supine to sit: Min assist;HOB elevated     General bed mobility comments: pt self assisting with gait belt RLE, pt requiring assist wtih LLE and incr time   Transfers Overall transfer level: Needs assistance Equipment used: Rolling walker (2 wheeled) Transfers: Sit to/from Stand Sit to Stand: Min assist;From elevated surface         General transfer comment: assist to steady and stabilize RW. cues for technique  Ambulation/Gait Ambulation/Gait assistance: Min guard Gait Distance (Feet): 45 Feet Assistive device: Rolling walker (2 wheeled) Gait Pattern/deviations: Step-to pattern;Decreased step length - right;Decreased step length - left Gait velocity: decr   General Gait Details: cues for  sequence and position from RW; distance ltd by pain   Stairs             Wheelchair Mobility    Modified Rankin (Stroke Patients Only)       Balance Overall balance assessment: Needs assistance Sitting-balance support: Bilateral upper extremity supported;Feet supported Sitting balance-Leahy Scale:  Fair     Standing balance support: Bilateral upper extremity supported Standing balance-Leahy Scale: Poor                              Cognition Arousal/Alertness: Awake/alert Behavior During Therapy: WFL for tasks assessed/performed Overall Cognitive Status: Within Functional Limits for tasks assessed                                        Exercises Total Joint Exercises Ankle Circles/Pumps: AROM;Both;15 reps;Supine Quad Sets: AROM;Both;10 reps;Supine    General Comments        Pertinent Vitals/Pain Pain Assessment: 0-10 Pain Score: 6  Pain Location: Bil hips Pain Descriptors / Indicators: Guarding;Aching;Sore Pain Intervention(s): Limited activity within patient's tolerance;Monitored during session;Premedicated before session;Repositioned;Ice applied    Home Living                      Prior Function            PT Goals (current goals can now be found in the care plan section) Acute Rehab PT Goals Patient Stated Goal: Regain IND PT Goal Formulation: With patient Time For Goal Achievement: 03/16/19 Potential to Achieve Goals: Good Progress towards PT goals: Progressing toward goals    Frequency    7X/week      PT Plan Current plan remains appropriate    Co-evaluation PT/OT/SLP Co-Evaluation/Treatment: Yes            AM-PAC PT "6 Clicks" Mobility  Outcome Measure  Help needed turning from your back to your side while in a flat bed without using bedrails?: A Lot Help needed moving from lying on your back to sitting on the side of a flat bed without using bedrails?: A Little Help needed moving to and from a bed to a chair (including a wheelchair)?: A Little Help needed standing up from a chair using your arms (e.g., wheelchair or bedside chair)?: A Little Help needed to walk in hospital room?: A Little Help needed climbing 3-5 steps with a railing? : A Lot 6 Click Score: 16    End of Session Equipment  Utilized During Treatment: Gait belt Activity Tolerance: Patient limited by fatigue;Patient limited by pain Patient left: in chair;with call bell/phone within reach;with chair alarm set Nurse Communication: Mobility status PT Visit Diagnosis: Difficulty in walking, not elsewhere classified (R26.2)     Time: 2952-8413 PT Time Calculation (min) (ACUTE ONLY): 21 min  Charges:  $Gait Training: 8-22 mins                     Delice Bison, PT   Acute Rehab Dept [facility]): [phone]   03/04/2019    [facility] 03/04/2019, 12:06 PM

## 2019-03-05 LAB — CBC
HCT: 31 % — ABNORMAL LOW (ref 39.0–52.0)
Hemoglobin: 10 g/dL — ABNORMAL LOW (ref 13.0–17.0)
MCH: 30.4 pg (ref 26.0–34.0)
MCHC: 32.3 g/dL (ref 30.0–36.0)
MCV: 94.2 fL (ref 80.0–100.0)
Platelets: 476 10*3/uL — ABNORMAL HIGH (ref 150–400)
RBC: 3.29 MIL/uL — ABNORMAL LOW (ref 4.22–5.81)
RDW: 12 % (ref 11.5–15.5)
WBC: 12 10*3/uL — ABNORMAL HIGH (ref 4.0–10.5)
nRBC: 0 % (ref 0.0–0.2)

## 2019-03-05 MED ORDER — METHOCARBAMOL 500 MG PO TABS: 500.0000 mg | ORAL_TABLET | Freq: Four times a day (QID) | ORAL | 1 refills | Status: DC | PRN

## 2019-03-05 MED ORDER — OXYCODONE HCL 5 MG PO TABS: 5.0000 mg | ORAL_TABLET | ORAL | 0 refills | Status: AC | PRN

## 2019-03-05 MED ORDER — ASPIRIN 81 MG PO CHEW: 81.0000 mg | CHEWABLE_TABLET | Freq: Two times a day (BID) | ORAL | 0 refills | Status: DC

## 2019-03-05 MED FILL — ASPIRIN LOW DOSE 81 MG CHEW: 81 | 17 days supply | Qty: 35 | Fill #0

## 2019-03-05 MED FILL — METHOCARBAMOL 500 MG TABS: 500 | 10 days supply | Qty: 40 | Fill #0

## 2019-03-05 MED FILL — oxyCODONE HCL 5 MG TABS: 5 | 7 days supply | Qty: 40 | Fill #0

## 2019-03-05 NOTE — Progress Notes (Signed)
Please call patients mother if there is an concern, the other contact, "amy" is back in [place].

## 2019-03-05 NOTE — Plan of Care (Addendum)
Pt blood pressure has dropped 85/60 upon standing and working with PT. MD notified, will continue to monitor.

## 2019-03-05 NOTE — Progress Notes (Signed)
Patient ID: Samuel Tate, male   DOB: [birth year], 28 y.o.   MRN: [id number] The patient does need to stay today.  My feeling is that he is on a decent amount of pain medication combined with several different blood pressure medications.  His hemoglobin today is 10.0 which is stable for someone who is only 28 years old.  We are going at least hold 2 of his blood pressure medications and hopefully be able to discharge him to home tomorrow.

## 2019-03-05 NOTE — Progress Notes (Signed)
Occupational Therapy Treatment Patient Details Name: Samuel Tate MRN: [id number] DOB: [birth year] Today's Date: 03/05/2019    History of present illness Patient is 28 y.o. male s/p Bil THA anterior approach on 03/01/19 with PMH significant for avascular necrosis of bil hips, HTN, depression, and anxiety.   OT comments  Pt progressing toward established OT goals. Pt required minguard for bed mobility. He donned upper body and lower body clothing while sitting EOB with increased time and effort and 1 rest break. Pt used reacher to assist with LB dressing. Pt required minA for stand-pivot transfer from EOB to recliner, he is limited by pain this session. Pt will continue to benefit from skilled OT services to maximize safety and independence with ADL/IADL and functional mobility. Will continue to follow acutely and progress as tolerated.    Follow Up Recommendations  Home health OT;Supervision/Assistance - 24 hour    Equipment Recommendations  3 in 1 bedside commode;Tub/shower bench    Recommendations for Other Services      Precautions / Restrictions Precautions Precautions: Fall Restrictions Weight Bearing Restrictions: No Other Position/Activity Restrictions: WBAT       Mobility Bed Mobility Overal bed mobility: Needs Assistance Bed Mobility: Supine to Sit     Supine to sit: Min guard;HOB elevated     General bed mobility comments: increased time and effort with use of bed rails to progress to upright position  Transfers Overall transfer level: Needs assistance Equipment used: Rolling walker (2 wheeled) Transfers: Sit to/from [employer] Sit to Stand: Min assist;From elevated surface Stand pivot transfers: Min assist       General transfer comment: assist to stabilize RW;pt with c/o dizziness in standing    Balance Overall balance assessment: Needs assistance Sitting-balance support: No upper extremity supported;Feet supported Sitting balance-Leahy  Scale: Fair Sitting balance - Comments: donned UB and LB clothing sitting EOB   Standing balance support: Single extremity supported;Bilateral upper extremity supported Standing balance-Leahy Scale: Poor Standing balance comment: heavy reliance on BUE support during mobility;able to adjust pants standing                            ADL either performed or assessed with clinical judgement   ADL Overall ADL's : Needs assistance/impaired                 Upper Body Dressing : Set up;Sitting Upper Body Dressing Details (indicate cue type and reason): doffed gowns and donned shirt sitting EOB Lower Body Dressing: Minimal assistance;Sit to/from stand Lower Body Dressing Details (indicate cue type and reason): donned briefs and pants sitting EOB with use of reacher;minA for pant leg management over bilateral feet Toilet Transfer: Minimal assistance;Stand-pivot Toilet Transfer Details (indicate cue type and reason): simulated from EOB to recliner Toileting- Clothing Manipulation and Hygiene: Minimal assistance;Sit to/from stand Toileting - Clothing Manipulation Details (indicate cue type and reason): simulated     Functional mobility during ADLs: Minimal assistance;Rolling walker General ADL Comments: pt donned full body clothing sitting EOB with extended time and effort and use of reacher to assist with LB dressing;pt required 1 seated rest break following LB dressing;discussed energy conservation strategies during dressing     Vision       Perception     Praxis      Cognition Arousal/Alertness: Awake/alert Behavior During Therapy: WFL for tasks assessed/performed Overall Cognitive Status: Within Functional Limits for tasks assessed  Exercises     Shoulder Instructions       General Comments vss throughout    Pertinent Vitals/ Pain       Pain Assessment: 0-10 Pain Score: 7  Pain Location: Bil  hips Pain Descriptors / Indicators: Guarding;Aching;Sore Pain Intervention(s): Limited activity within patient's tolerance;Monitored during session;Repositioned  Home Living                                          Prior Functioning/Environment              Frequency  Min 2X/week        Progress Toward Goals  OT Goals(current goals can now be found in the care plan section)  Progress towards OT goals: Progressing toward goals  Acute Rehab OT Goals Patient Stated Goal: get back to prior level OT Goal Formulation: With patient Time For Goal Achievement: 03/17/19 Potential to Achieve Goals: Good ADL Goals Pt Will Perform Lower Body Bathing: sit to/from stand;with supervision Pt Will Perform Lower Body Dressing: with supervision;sit to/from stand Pt Will Transfer to Toilet: with supervision;ambulating Pt Will Perform Toileting - Clothing Manipulation and hygiene: with supervision;sit to/from stand;sitting/lateral leans Pt Will Perform Tub/Shower Transfer: with supervision;ambulating;Tub transfer;tub bench;rolling walker Additional ADL Goal #1: Pt will complete bed mobility at supervision level to prepare for OOB ADLs.  Plan Discharge plan remains appropriate    Co-evaluation                 AM-PAC OT "6 Clicks" Daily Activity     Outcome Measure   Help from another person eating meals?: None Help from another person taking care of personal grooming?: A Little Help from another person toileting, which includes using toliet, bedpan, or urinal?: A Lot Help from another person bathing (including washing, rinsing, drying)?: A Lot Help from another person to put on and taking off regular upper body clothing?: A Little Help from another person to put on and taking off regular lower body clothing?: A Lot 6 Click Score: 16    End of Session Equipment Utilized During Treatment: Gait belt;Rolling walker  OT Visit Diagnosis: Unsteadiness on feet  (R26.81);Pain Pain - Right/Left: (bilateral) Pain - part of body: Hip   Activity Tolerance Patient tolerated treatment well;Patient limited by pain   Patient Left in chair;with call bell/phone within reach   Nurse Communication Mobility status        Time: 7510-2585 OT Time Calculation (min): 30 min  Charges: OT General Charges $OT Visit: 1 Visit OT Treatments $Self Care/Home Management : 23-37 mins  Dorinda Apollo OTR/L Acute Rehabilitation Services Office: [place] 03/05/2019, 10:41 AM

## 2019-03-05 NOTE — Progress Notes (Signed)
Physical Therapy Treatment Patient Details Name: Samuel Tate MRN: [id number] DOB: 18-Aug-1990 Today's Date: 03/05/2019    History of Present Illness Patient is 28 y.o. male s/p Bil THA anterior approach on 03/01/19 with PMH significant for avascular necrosis of bil hips, HTN, depression, and anxiety.    PT Comments    Pt ambulated 10' with RW, distance limited by onset of dizziness in standing. BP in sitting 103/59, HR 94, SaO2 100% on room air. Pt is not ready to DC home from PT standpoint. He will need to be able to tolerate longer distance with ambulation and complete stair training. RN aware and will monitor BP medication.    Follow Up Recommendations  Home health PT;Follow surgeon's recommendation for DC plan and follow-up therapies     Equipment Recommendations  Rolling walker with 5" wheels    Recommendations for Other Services       Precautions / Restrictions Precautions Precautions: Fall Restrictions Weight Bearing Restrictions: No Other Position/Activity Restrictions: WBAT    Mobility  Bed Mobility Overal bed mobility: Needs Assistance Bed Mobility: Supine to Sit     Supine to sit: Min guard;HOB elevated     General bed mobility comments: up in recliner  Transfers Overall transfer level: Needs assistance Equipment used: Rolling walker (2 wheeled) Transfers: Sit to/from Stand Sit to Stand: Min assist Stand pivot transfers: Min assist       General transfer comment: VCs hand placement, min A to rise from recliner, pt reported mild dizziness in standing; increased time with all mobility  Ambulation/Gait Ambulation/Gait assistance: Min guard Gait Distance (Feet): 10 Feet Assistive device: Rolling walker (2 wheeled) Gait Pattern/deviations: Step-through pattern;Decreased step length - right;Decreased step length - left Gait velocity: decr   General Gait Details: ambulation slow and labored, distance limited by dizziness, assisted pt to recliner where  sitting BP was 103/59, HR 94, SaO2 100% on room air   Stairs             Wheelchair Mobility    Modified Rankin (Stroke Patients Only)       Balance Overall balance assessment: Needs assistance Sitting-balance support: No upper extremity supported;Feet supported Sitting balance-Leahy Scale: Fair Sitting balance - Comments: donned UB and LB clothing sitting EOB   Standing balance support: Single extremity supported;Bilateral upper extremity supported Standing balance-Leahy Scale: Poor Standing balance comment: heavy reliance on BUE support during mobility                            Cognition Arousal/Alertness: Awake/alert Behavior During Therapy: WFL for tasks assessed/performed Overall Cognitive Status: Within Functional Limits for tasks assessed                                        Exercises Total Joint Exercises Ankle Circles/Pumps: AROM;Both;15 reps;Supine Quad Sets: AROM;Both;10 reps;Supine Short Arc Quad: AROM;Both;10 reps Heel Slides: AAROM;Both;Supine;10 reps Hip ABduction/ADduction: AAROM;Both;10 reps;Supine    General Comments General comments (skin integrity, edema, etc.): vss throughout      Pertinent Vitals/Pain Pain Assessment: 0-10 Pain Score: 7  Pain Location: B hips Pain Descriptors / Indicators: Crying;Grimacing;Guarding Pain Intervention(s): Limited activity within patient's tolerance;Monitored during session;Premedicated before session;Repositioned;RN gave pain meds during session(declined ice)    Home Living                      Prior  Function            PT Goals (current goals can now be found in the care plan section) Acute Rehab PT Goals Patient Stated Goal: walk without waddling PT Goal Formulation: With patient Time For Goal Achievement: 03/16/19 Potential to Achieve Goals: Good Progress towards PT goals: Progressing toward goals    Frequency    7X/week      PT Plan Current  plan remains appropriate    Co-evaluation              AM-PAC PT "6 Clicks" Mobility   Outcome Measure  Help needed turning from your back to your side while in a flat bed without using bedrails?: A Lot Help needed moving from lying on your back to sitting on the side of a flat bed without using bedrails?: A Little Help needed moving to and from a bed to a chair (including a wheelchair)?: A Little Help needed standing up from a chair using your arms (e.g., wheelchair or bedside chair)?: A Little Help needed to walk in hospital room?: A Little Help needed climbing 3-5 steps with a railing? : A Lot 6 Click Score: 16    End of Session Equipment Utilized During Treatment: Gait belt Activity Tolerance: Patient limited by fatigue;Treatment limited secondary to medical complications (Comment);Patient limited by pain(dizziness in standing) Patient left: with call bell/phone within reach;with family/visitor present;in chair Nurse Communication: Mobility status PT Visit Diagnosis: Difficulty in walking, not elsewhere classified (R26.2)     Time: 5916-3846 PT Time Calculation (min) (ACUTE ONLY): 32 min  Charges:  $Gait Training: 8-22 mins $Therapeutic Exercise: 8-22 mins                    Blondell Reveal Kistler PT 03/05/2019  Acute Rehabilitation Services Pager [phone] Office [phone]

## 2019-03-05 NOTE — Progress Notes (Signed)
Subjective: 4 Days Post-Op Procedure(s) (LRB): BILATERAL ANTERIOR TOTAL HIP ARTHROPLASTY (Bilateral) Patient reports pain as moderate.  Reports chills last night. No chest pain, SOB , nausea or vomiting.   Objective: Vital signs in last 24 hours: Temp:  [99.1 F (37.3 C)-100.6 F (38.1 C)] 99.1 F (37.3 C) (12/15 0616) Pulse Rate:  [84-105] 86 (12/15 0616) Resp:  [15-20] 15 (12/15 0616) BP: (124-134)/(69-81) 134/69 (12/15 0616) SpO2:  [95 %-99 %] 96 % (12/15 0616)  Intake/Output from previous day: 12/14 0701 - 12/15 0700 In: 840 [P.O.:840] Out: 2825 [Urine:2825] Intake/Output this shift: No intake/output data recorded.  No results for input(s): HGB in the last 72 hours. No results for input(s): WBC, RBC, HCT, PLT in the last 72 hours. No results for input(s): NA, K, CL, CO2, BUN, CREATININE, GLUCOSE, CALCIUM in the last 72 hours. No results for input(s): LABPT, INR in the last 72 hours.  Dorsiflexion/Plantar flexion intact Incision: dressing C/D/I Compartment soft   Assessment/Plan: 4 Days Post-Op Procedure(s) (LRB): BILATERAL ANTERIOR TOTAL HIP ARTHROPLASTY (Bilateral) Up with therapy Discharge home with home health later today if patient remains stable and ready from PT standpoint.     Samuel Tate 03/05/2019, 8:11 AM

## 2019-03-05 NOTE — Progress Notes (Signed)
Dr Rush Farmer ordered to hold all bp medications except for  Cozaar on 12/16.

## 2019-03-05 NOTE — Discharge Summary (Signed)
Patient ID: Samuel Tate MRN: [id number] DOB/AGE: 1990-08-26 28 y.o.  Admit date: 03/01/2019 Discharge date: 03/05/2019  Admission Diagnoses:  Principal Problem:   Avascular necrosis of bones of both hips [facility](HCC) Active Problems:   Status post bilateral total hip replacement   Discharge Diagnoses:  Same  Past Medical History:  Diagnosis Date  . Anxiety   . Depression   . Dyspnea    with panic attacks. uses inhaler  . Hypertension     Surgeries: Procedure(s): BILATERAL ANTERIOR TOTAL HIP ARTHROPLASTY on 03/01/2019   Consultants:   Discharged Condition: Improved  Hospital Course: Samuel Tate is an 28 y.o. male who was admitted 03/01/2019 for operative treatment ofAvascular necrosis of bones of both hips (HCC). Patient has severe unremitting pain that affects sleep, daily activities, and work/hobbies. After pre-op clearance the patient was taken to the operating room on 03/01/2019 and underwent  Procedure(s): BILATERAL ANTERIOR TOTAL HIP ARTHROPLASTY.    Patient was given perioperative antibiotics:  Anti-infectives (From admission, onward)   Start     Dose/Rate Route Frequency Ordered Stop   03/01/19 1800  ceFAZolin (ANCEF) IVPB 1 g/50 mL premix     1 g 100 mL/hr over 30 Minutes Intravenous Every 6 hours 03/01/19 1646 03/02/19 0124   03/01/19 0945  ceFAZolin (ANCEF) IVPB 2g/100 mL premix     2 g 200 mL/hr over 30 Minutes Intravenous On call to O.R. 03/01/19 [id number] 03/01/19 1205       Patient was given sequential compression devices, early ambulation, and chemoprophylaxis to prevent DVT.  Patient benefited maximally from hospital stay and there were no complications.    Recent vital signs:  Patient Vitals for the past 24 hrs:  BP Temp Temp src Pulse Resp SpO2  03/05/19 0616 134/69 99.1 F (37.3 C) Oral 86 15 96 %  03/04/19 2137 -- 99.5 F (37.5 C) Oral -- -- --  03/04/19 2017 131/81 (!) 100.6 F (38.1 C) Oral (!) 105 20 95 %  03/04/19 1310 124/79 99.8 F (37.7 C) Oral  84 17 99 %     Recent laboratory studies: No results for input(s): WBC, HGB, HCT, PLT, NA, K, CL, CO2, BUN, CREATININE, GLUCOSE, INR, CALCIUM in the last 72 hours.  Invalid input(s): PT, 2   Discharge Medications:   Allergies as of 03/05/2019   No Known Allergies     Medication List    STOP taking these medications   acetaminophen 500 MG tablet Commonly known as: TYLENOL     TAKE these medications   albuterol 108 (90 Base) MCG/ACT inhaler Commonly known as: VENTOLIN HFA Inhale 1-2 puffs into the lungs every 6 (six) hours as needed for wheezing or shortness of breath.   alendronate 70 MG tablet Commonly known as: FOSAMAX Take 70 mg by mouth once a week. Take with a full glass of water on an empty stomach.   amLODipine 2.5 MG tablet Commonly known as: NORVASC Take 1 tablet (2.5 mg total) by mouth daily.   aspirin 81 MG chewable tablet Chew 1 tablet (81 mg total) by mouth 2 (two) times daily.   diphenhydramine-acetaminophen 25-500 MG Tabs tablet Commonly known as: TYLENOL PM Take 2 tablets by mouth at bedtime as needed (sleep/pain).   FLUoxetine 20 MG capsule Commonly known as: PROZAC Take 20 mg by mouth daily.   hydrochlorothiazide 25 MG tablet Commonly known as: HYDRODIURIL Take 25 mg by mouth daily.   losartan-hydrochlorothiazide 100-25 MG tablet Commonly known as: HYZAAR Take 1 tablet by mouth daily.  methocarbamol 500 MG tablet Commonly known as: ROBAXIN Take 1 tablet (500 mg total) by mouth every 6 (six) hours as needed for muscle spasms.   metoprolol succinate 25 MG 24 hr tablet Commonly known as: TOPROL-XL Take 25 mg by mouth daily.   omeprazole 20 MG capsule Commonly known as: PRILOSEC Take 20 mg by mouth daily.   oxyCODONE 5 MG immediate release tablet Commonly known as: Oxy IR/ROXICODONE Take 1-2 tablets (5-10 mg total) by mouth every 4 (four) hours as needed for up to 7 days for moderate pain (pain score 4-6).   QUEtiapine 100 MG  tablet Commonly known as: SEROQUEL Take 100 mg by mouth at bedtime.            Durable Medical Equipment  (From admission, onward)         Start     Ordered   03/01/19 1646  DME 3 n 1  Once     03/01/19 1646   03/01/19 1646  DME Walker rolling  Once    Question:  Patient needs a walker to treat with the following condition  Answer:  Status post bilateral total hip replacement   03/01/19 1646          Diagnostic Studies: DG Pelvis Portable  Result Date: 03/01/2019 CLINICAL DATA:  Bilateral hip arthroplasty EXAM: PORTABLE PELVIS 1-2 VIEWS COMPARISON:  01/03/2019 FINDINGS: Interval postsurgical changes from bilateral total hip arthroplasty. Arthroplasty components are in their expected alignment without evidence of complication. Expected postoperative changes within the overlying soft tissues. IMPRESSION: Postsurgical changes of bilateral total hip arthroplasty without evidence of complication. Electronically Signed   By: Duanne Guess M.D.   On: 03/01/2019 15:25   DG C-Arm 1-60 Min-No Report  Result Date: 03/01/2019 Fluoroscopy was utilized by the requesting physician.  No radiographic interpretation.   DG C-Arm 1-60 Min-No Report  Result Date: 03/01/2019 Fluoroscopy was utilized by the requesting physician.  No radiographic interpretation.   DG HIP OPERATIVE UNILAT W OR W/O PELVIS LEFT  Result Date: 03/01/2019 CLINICAL DATA:  Order for left total hip replacement Total fluoro time 15 secs EXAM: OPERATIVE LEFT HIP (WITH PELVIS IF PERFORMED) multiple VIEWS TECHNIQUE: Fluoroscopic spot image(s) were submitted for interpretation post-operatively. COMPARISON:  None. FINDINGS: Four intraoperative films demonstrate LEFT hip total arthroplasty. No fracture dislocation. IMPRESSION: LEFT hip arthroplasty without complication. Electronically Signed   By: Genevive Bi M.D.   On: 03/01/2019 13:45   DG HIP OPERATIVE UNILAT W OR W/O PELVIS RIGHT  Result Date: 03/01/2019 CLINICAL  DATA:  Right total hip replacement EXAM: OPERATIVE RIGHT HIP WITH PELVIS COMPARISON:  Same-day fluoroscopic images. FINDINGS: Sequential images depict interval placement of a bipolar articulating right femoral total hip arthroplasty. Hardware is in expected alignment without acute complication. Prior left hip total arthroplasty noted as well. IMPRESSION: Expected intraoperative appearance status post right total hip arthroplasty. Electronically Signed   By: Kreg Shropshire M.D.   On: 03/01/2019 15:04    Disposition: Discharge disposition: 01-Home or Self Care         Follow-up Information    Kathryne Hitch, MD. Schedule an appointment as soon as possible for a visit in 2 week(s).   Specialty: Orthopedic Surgery Contact information: [address] [place] [address] [phone]        [facility] Follow up.   Specialty: Home Health Services Why: agency will provide home health physical therapy. agency will call you to schedule first visit.  Contact information: [address]  [place] [address] [phone]            Signed: Erskine Emery 03/05/2019, 8:05 AM

## 2019-03-05 NOTE — Progress Notes (Addendum)
Physical Therapy Treatment Patient Details Name: Samuel Tate MRN: [id number] DOB: 16-Apr-1990 Today's Date: 03/05/2019    History of Present Illness Patient is 28 y.o. male s/p Bil THA anterior approach on 03/01/19 with PMH significant for avascular necrosis of bil hips, HTN, depression, and anxiety.    PT Comments    Pt reported dizziness in standing, standing BP 85/60, HR 123 standing. Assisted pt to bed, ambulation deferred 2* hypotension. Pt is not ready to DC home from PT standpoint.   Follow Up Recommendations  Home health PT;Follow surgeon's recommendation for DC plan and follow-up therapies     Equipment Recommendations  Rolling walker with 5" wheels (wide)   Recommendations for Other Services       Precautions / Restrictions Precautions Precautions: Fall Precaution Comments: monitor BP (pt has h/o HTN and was put on a new BP med just prior to surgery) Restrictions Weight Bearing Restrictions: No Other Position/Activity Restrictions: WBAT    Mobility  Bed Mobility Overal bed mobility: Needs Assistance Bed Mobility: Sit to Supine     Supine to sit: Min guard;HOB elevated Sit to supine: Mod assist;+2 for physical assistance   General bed mobility comments: assist to bring BLEs into bed and to lower shoulders  Transfers Overall transfer level: Needs assistance Equipment used: Rolling walker (2 wheeled) Transfers: Sit to/from Stand Sit to Stand: Min guard Stand pivot transfers: Min guard       General transfer comment: good hand placement, sit to stand x 2 from recliner using armrest; pt dizzy in standing, BP standing 85/60, HR 123 in standing, RN notified  Ambulation/Gait Ambulation/Gait assistance: Min guard Gait Distance (Feet): 10 Feet Assistive device: Rolling walker (2 wheeled) Gait Pattern/deviations: Step-through pattern;Decreased step length - right;Decreased step length - left Gait velocity: decr   General Gait Details: deferred 2* dizziness in  standing   Stairs             Wheelchair Mobility    Modified Rankin (Stroke Patients Only)       Balance Overall balance assessment: Needs assistance Sitting-balance support: No upper extremity supported;Feet supported Sitting balance-Leahy Scale: Fair Sitting balance - Comments: donned UB and LB clothing sitting EOB   Standing balance support: Single extremity supported;Bilateral upper extremity supported Standing balance-Leahy Scale: Poor Standing balance comment: heavy reliance on BUE support during mobility                            Cognition Arousal/Alertness: Awake/alert Behavior During Therapy: WFL for tasks assessed/performed Overall Cognitive Status: Within Functional Limits for tasks assessed                                        Exercises Total Joint Exercises Ankle Circles/Pumps: AROM;Both;15 reps;Supine Quad Sets: AROM;Both;10 reps;Supine Short Arc Quad: AROM;Both;10 reps Heel Slides: AAROM;Both;Supine;10 reps Hip ABduction/ADduction: AAROM;Both;10 reps;Supine    General Comments General comments (skin integrity, edema, etc.): vss throughout      Pertinent Vitals/Pain Pain Assessment: 0-10 Pain Score: 5  Pain Location: B hips Pain Descriptors / Indicators: Sore;Guarding;Grimacing Pain Intervention(s): Limited activity within patient's tolerance;Monitored during session;Premedicated before session;Repositioned;Ice applied    Home Living                      Prior Function            PT Goals (current  goals can now be found in the care plan section) Acute Rehab PT Goals Patient Stated Goal: walk without waddling PT Goal Formulation: With patient Time For Goal Achievement: 03/16/19 Potential to Achieve Goals: Good Progress towards PT goals: Progressing toward goals    Frequency    7X/week      PT Plan Current plan remains appropriate    Co-evaluation              AM-PAC PT "6  Clicks" Mobility   Outcome Measure  Help needed turning from your back to your side while in a flat bed without using bedrails?: A Lot Help needed moving from lying on your back to sitting on the side of a flat bed without using bedrails?: A Little Help needed moving to and from a bed to a chair (including a wheelchair)?: A Little Help needed standing up from a chair using your arms (e.g., wheelchair or bedside chair)?: A Little Help needed to walk in hospital room?: A Little Help needed climbing 3-5 steps with a railing? : A Lot 6 Click Score: 16    End of Session Equipment Utilized During Treatment: Gait belt Activity Tolerance: Patient limited by fatigue;Treatment limited secondary to medical complications (Comment);Patient limited by pain(dizziness in standing) Patient left: with call bell/phone within reach;with family/visitor present;in bed Nurse Communication: Mobility status PT Visit Diagnosis: Difficulty in walking, not elsewhere classified (R26.2)     Time: 3646-8032 PT Time Calculation (min) (ACUTE ONLY): 25 min  Charges:  $Therapeutic activity (23-37 min)                     Philomena Doheny PT 03/05/2019  Acute Rehabilitation Services Pager [phone] Office [phone]

## 2019-03-06 NOTE — Discharge Summary (Signed)
Patient ID: Samuel Tate Rathel MRN: [id number] DOB/AGE: 1990/05/16 28 y.o.  Admit date: 03/01/2019 Discharge date: 03/06/2019  Admission Diagnoses:  Principal Problem:   Avascular necrosis of bones of both hips (HCC) Active Problems:   Status post bilateral total hip replacement   Discharge Diagnoses:  Status post bilateral total hip arthroplasties Hypotension   Past Medical History:  Diagnosis Date  . Anxiety   . Depression   . Dyspnea    with panic attacks. uses inhaler  . Hypertension     Surgeries: Procedure(s): BILATERAL ANTERIOR TOTAL HIP ARTHROPLASTY on 03/01/2019   Consultants:   Discharged Condition: Improved  Hospital Course: Samuel Tate Sirek is an 28 y.o. male who was admitted 03/01/2019 for operative treatment ofAvascular necrosis of bones of both hips (HCC). Patient has severe unremitting pain that affects sleep, daily activities, and work/hobbies. After pre-op clearance the patient was taken to the operating room on 03/01/2019 and underwent  Procedure(s): BILATERAL ANTERIOR TOTAL HIP ARTHROPLASTY.    Patient was given perioperative antibiotics:  Anti-infectives (From admission, onward)   Start     Dose/Rate Route Frequency Ordered Stop   03/01/19 1800  ceFAZolin (ANCEF) IVPB 1 g/50 mL premix     1 g 100 mL/hr over 30 Minutes Intravenous Every 6 hours 03/01/19 1646 03/02/19 0124   03/01/19 0945  ceFAZolin (ANCEF) IVPB 2g/100 mL premix     2 g 200 mL/hr over 30 Minutes Intravenous On call to O.R. 03/01/19 [id number] 03/01/19 1205       Patient was given sequential compression devices, early ambulation, and chemoprophylaxis to prevent DVT.  Patient benefited maximally from hospital stay and there were no complications.    Recent vital signs:  Patient Vitals for the past 24 hrs:  BP Temp Temp src Pulse Resp SpO2  03/06/19 0618 138/75 98.5 F (36.9 C) Oral 78 20 100 %  03/06/19 0204 115/74 99 F (37.2 C) Oral 96 16 94 %  03/05/19 1420 112/61 98.8 F (37.1 C) Oral 88 17  100 %  03/05/19 1335 (!) 85/60 -- -- -- -- --  03/05/19 1144 (!) 103/59 -- -- 94 -- 100 %     Recent laboratory studies:  Recent Labs    03/05/19 1453  WBC 12.0*  HGB 10.0*  HCT 31.0*  PLT 476*     Discharge Medications:   Allergies as of 03/06/2019   No Known Allergies     Medication List    STOP taking these medications   acetaminophen 500 MG tablet Commonly known as: TYLENOL   amLODipine 2.5 MG tablet Commonly known as: NORVASC   hydrochlorothiazide 25 MG tablet Commonly known as: HYDRODIURIL   metoprolol succinate 25 MG 24 hr tablet Commonly known as: TOPROL-XL     TAKE these medications   albuterol 108 (90 Base) MCG/ACT inhaler Commonly known as: VENTOLIN HFA Inhale 1-2 puffs into the lungs every 6 (six) hours as needed for wheezing or shortness of breath.   alendronate 70 MG tablet Commonly known as: FOSAMAX Take 70 mg by mouth once a week. Take with a full glass of water on an empty stomach.   aspirin 81 MG chewable tablet Chew 1 tablet (81 mg total) by mouth 2 (two) times daily.   diphenhydramine-acetaminophen 25-500 MG Tabs tablet Commonly known as: TYLENOL PM Take 2 tablets by mouth at bedtime as needed (sleep/pain).   FLUoxetine 20 MG capsule Commonly known as: PROZAC Take 20 mg by mouth daily.   losartan-hydrochlorothiazide 100-25 MG tablet Commonly known as: HYZAAR  Take 1 tablet by mouth daily.   methocarbamol 500 MG tablet Commonly known as: ROBAXIN Take 1 tablet (500 mg total) by mouth every 6 (six) hours as needed for muscle spasms.   omeprazole 20 MG capsule Commonly known as: PRILOSEC Take 20 mg by mouth daily.   oxyCODONE 5 MG immediate release tablet Commonly known as: Oxy IR/ROXICODONE Take 1-2 tablets (5-10 mg total) by mouth every 4 (four) hours as needed for up to 7 days for moderate pain (pain score 4-6).   QUEtiapine 100 MG tablet Commonly known as: SEROQUEL Take 100 mg by mouth at bedtime.            Durable  Medical Equipment  (From admission, onward)         Start     Ordered   03/01/19 1646  DME 3 n 1  Once     03/01/19 1646   03/01/19 1646  DME Walker rolling  Once    Question:  Patient needs a walker to treat with the following condition  Answer:  Status post bilateral total hip replacement   03/01/19 1646          Diagnostic Studies: DG Pelvis Portable  Result Date: 03/01/2019 CLINICAL DATA:  Bilateral hip arthroplasty EXAM: PORTABLE PELVIS 1-2 VIEWS COMPARISON:  01/03/2019 FINDINGS: Interval postsurgical changes from bilateral total hip arthroplasty. Arthroplasty components are in their expected alignment without evidence of complication. Expected postoperative changes within the overlying soft tissues. IMPRESSION: Postsurgical changes of bilateral total hip arthroplasty without evidence of complication. Electronically Signed   By: Duanne Guess M.D.   On: 03/01/2019 15:25   DG C-Arm 1-60 Min-No Report  Result Date: 03/01/2019 Fluoroscopy was utilized by the requesting physician.  No radiographic interpretation.   DG C-Arm 1-60 Min-No Report  Result Date: 03/01/2019 Fluoroscopy was utilized by the requesting physician.  No radiographic interpretation.   DG HIP OPERATIVE UNILAT W OR W/O PELVIS LEFT  Result Date: 03/01/2019 CLINICAL DATA:  Order for left total hip replacement Total fluoro time 15 secs EXAM: OPERATIVE LEFT HIP (WITH PELVIS IF PERFORMED) multiple VIEWS TECHNIQUE: Fluoroscopic spot image(s) were submitted for interpretation post-operatively. COMPARISON:  None. FINDINGS: Four intraoperative films demonstrate LEFT hip total arthroplasty. No fracture dislocation. IMPRESSION: LEFT hip arthroplasty without complication. Electronically Signed   By: Genevive Bi M.D.   On: 03/01/2019 13:45   DG HIP OPERATIVE UNILAT W OR W/O PELVIS RIGHT  Result Date: 03/01/2019 CLINICAL DATA:  Right total hip replacement EXAM: OPERATIVE RIGHT HIP WITH PELVIS COMPARISON:  Same-day  fluoroscopic images. FINDINGS: Sequential images depict interval placement of a bipolar articulating right femoral total hip arthroplasty. Hardware is in expected alignment without acute complication. Prior left hip total arthroplasty noted as well. IMPRESSION: Expected intraoperative appearance status post right total hip arthroplasty. Electronically Signed   By: Kreg Shropshire M.D.   On: 03/01/2019 15:04    Disposition: Discharge disposition: 01-Home or Self Care         Follow-up Information    Kathryne Hitch, MD. Schedule an appointment as soon as possible for a visit in 2 week(s).   Specialty: Orthopedic Surgery Contact information: [address] [place] [address] [phone]        [facility] Follow up.   Specialty: Home Health Services Why: agency will provide home health physical therapy. agency will call you to schedule first visit.  Contact information: [address] [place] [address] [phone]  SignedErskine Emery 03/06/2019, 9:53 AM

## 2019-03-06 NOTE — Progress Notes (Signed)
Spoke with Dr Jeanett Schlein and Dr Ninfa Linden who both agreed patient needs to stay another night until bp is stable. Verbal orders were given to hold all BP medications until discharged.

## 2019-03-06 NOTE — Progress Notes (Addendum)
Subjective: 5 Days Post-Op Procedure(s) (LRB): BILATERAL ANTERIOR TOTAL HIP ARTHROPLASTY (Bilateral) Patient reports pain as moderate.  No complaints this AM. Hypotensive event yesterday with PT most likely multifactorial. Hgb 10 yesterday and BP medications adjusted.   Objective: Vital signs in last 24 hours: Temp:  [98.5 F (36.9 C)-99 F (37.2 C)] 98.5 F (36.9 C) (12/16 0618) Pulse Rate:  [78-96] 78 (12/16 0618) Resp:  [16-20] 20 (12/16 0618) BP: (85-138)/(59-75) 138/75 (12/16 0618) SpO2:  [94 %-100 %] 100 % (12/16 0618)  Intake/Output from previous day: 12/15 0701 - 12/16 0700 In: 1148.2 [P.O.:1080; I.V.:68.2] Out: 2150 [Urine:2150] Intake/Output this shift: Total I/O In: 120 [P.O.:120] Out: -   Recent Labs    03/05/19 1453  HGB 10.0*   Recent Labs    03/05/19 1453  WBC 12.0*  RBC 3.29*  HCT 31.0*  PLT 476*   No results for input(s): NA, K, CL, CO2, BUN, CREATININE, GLUCOSE, CALCIUM in the last 72 hours. No results for input(s): LABPT, INR in the last 72 hours.  Bilateral lower extremities: Dorsiflexion/Plantar flexion intact Incision: dressing C/D/I   Assessment/Plan: 5 Days Post-Op Procedure(s) (LRB): BILATERAL ANTERIOR TOTAL HIP ARTHROPLASTY (Bilateral) Up with therapy Discussed with patient need to monitor BP at home. He is new to the area and will need to estabalish a relationship with a primary care physician.  Will send patient home on Hyzaar.  Will discharge home today if vitals remain stable and does well with PT.   Torie Priebe 03/06/2019, 9:26 AM

## 2019-03-06 NOTE — Progress Notes (Signed)
Physical Therapy Treatment Patient Details Name: Samuel Tate MRN: [id number] DOB: Feb 04, 1991 Today's Date: 03/06/2019    History of Present Illness Patient is 28 y.o. male s/p Bil THA anterior approach on 03/01/19 with PMH significant for avascular necrosis of bil hips, HTN, depression, and anxiety.    PT Comments    Pt attempted to mobilize however reports dizziness worse this afternoon and presents with hypotension and tachycardia, RN notified.  Pt does not appear safe for d/c home at this time.    Follow Up Recommendations  Home health PT;Follow surgeon's recommendation for DC plan and follow-up therapies     Equipment Recommendations  Rolling walker with 5" wheels(wide)    Recommendations for Other Services       Precautions / Restrictions Precautions Precautions: Fall Precaution Comments: monitor BP (pt has h/o HTN and was put on a new BP med just prior to surgery) Restrictions Other Position/Activity Restrictions: WBAT    Mobility  Bed Mobility Overal bed mobility: Needs Assistance Bed Mobility: Sit to Supine     Supine to sit: Supervision Sit to supine: Mod assist   General bed mobility comments: assist for LEs onto bed  Transfers Overall transfer level: Needs assistance Equipment used: Rolling walker (2 wheeled) Transfers: Sit to/from Stand Sit to Stand: Min assist Stand pivot transfers: Min assist       General transfer comment: assist to rise and steady, good hand placement; pt reports dizziness with rise (worse then this morning so pt returned to sitting and BP 109/64 mmHg HR 115 bpm)  Pt attempted to stand again and BP obtained in standing 103/77 mmHg and HR 139 bpm and pt again c/o dizziness so assisted back to bed  Ambulation/Gait   Stairs    Wheelchair Mobility    Modified Rankin (Stroke Patients Only)       Balance                                            Cognition Arousal/Alertness: Awake/alert Behavior During  Therapy: WFL for tasks assessed/performed Overall Cognitive Status: Within Functional Limits for tasks assessed                                        Exercises      General Comments        Pertinent Vitals/Pain Pain Assessment: 0-10 Pain Score: 5  Pain Location: B hips Pain Descriptors / Indicators: Sore;Guarding;Grimacing Pain Intervention(s): Repositioned;Monitored during session(declined premed)    Home Living                      Prior Function            PT Goals (current goals can now be found in the care plan section) Progress towards PT goals: Progressing toward goals    Frequency    7X/week      PT Plan Current plan remains appropriate    Co-evaluation              AM-PAC PT "6 Clicks" Mobility   Outcome Measure  Help needed turning from your back to your side while in a flat bed without using bedrails?: A Little Help needed moving from lying on your back to sitting on the side of a flat bed without  using bedrails?: A Little Help needed moving to and from a bed to a chair (including a wheelchair)?: A Little Help needed standing up from a chair using your arms (e.g., wheelchair or bedside chair)?: A Little Help needed to walk in hospital room?: A Little Help needed climbing 3-5 steps with a railing? : A Lot 6 Click Score: 17    End of Session Equipment Utilized During Treatment: Gait belt Activity Tolerance: Other (comment)(limited by dizziness) Patient left: with call bell/phone within reach;in bed Nurse Communication: Mobility status PT Visit Diagnosis: Difficulty in walking, not elsewhere classified (R26.2)     Time: 7412-8786 PT Time Calculation (min) (ACUTE ONLY): 19 min  Charges:   $Therapeutic Activity: 8-22 mins                     Arlyce Dice, DPT Acute Rehabilitation Services Office: [phone]  York Ram E 03/06/2019, 2:35 PM

## 2019-03-06 NOTE — Progress Notes (Signed)
Physical Therapy Treatment Patient Details Name: Samuel Tate MRN: [id number] DOB: Jan 07, 1991 Today's Date: 03/06/2019    History of Present Illness Patient is 28 y.o. male s/p Bil THA anterior approach on 03/01/19 with PMH significant for avascular necrosis of bil hips, HTN, depression, and anxiety.    PT Comments    Pt assisted with ambulating short distance and performed steps however became dizzy with negotiating stairs so recliner brought to pt.  BP as below (did not drop).  Follow Up Recommendations  Home health PT;Follow surgeon's recommendation for DC plan and follow-up therapies     Equipment Recommendations  Rolling walker with 5" wheels(needs wide)    Recommendations for Other Services       Precautions / Restrictions Precautions Precautions: Fall Precaution Comments: monitor BP (pt has h/o HTN and was put on a new BP med just prior to surgery) Restrictions Other Position/Activity Restrictions: WBAT    Mobility  Bed Mobility Overal bed mobility: Needs Assistance Bed Mobility: Supine to Sit     Supine to sit: Supervision     General bed mobility comments: slow but able to self assist LEs over EOB; BP upon sitting EOB 128/75 mmHg and HR 107  Transfers Overall transfer level: Needs assistance Equipment used: Rolling walker (2 wheeled) Transfers: Sit to/from Stand Sit to Stand: Min guard         General transfer comment: slow to rise but no assist required  Ambulation/Gait Ambulation/Gait assistance: Min guard Gait Distance (Feet): 25 Feet Assistive device: Rolling walker (2 wheeled) Gait Pattern/deviations: Step-through pattern;Decreased stride length;Wide base of support Gait velocity: decr   General Gait Details: verbal cues for sequencing, RW distance   Stairs Stairs: Yes Stairs assistance: Min guard;Min assist Stair Management: Step to pattern;Backwards;With walker Number of Stairs: 2 General stair comments: verbal cues for sequence, RW  positioning, assist for steadying as pt dizzy upon ascending so assisted with descent and recliner brought to pt; BP 140/82 mmHg and HR 118   Wheelchair Mobility    Modified Rankin (Stroke Patients Only)       Balance                                            Cognition Arousal/Alertness: Awake/alert Behavior During Therapy: WFL for tasks assessed/performed Overall Cognitive Status: Within Functional Limits for tasks assessed                                        Exercises      General Comments        Pertinent Vitals/Pain Pain Assessment: 0-10 Pain Score: 3  Pain Location: B hips Pain Descriptors / Indicators: Sore;Guarding;Grimacing Pain Intervention(s): Monitored during session;Premedicated before session;Repositioned    Home Living                      Prior Function            PT Goals (current goals can now be found in the care plan section) Progress towards PT goals: Progressing toward goals    Frequency    7X/week      PT Plan Current plan remains appropriate    Co-evaluation              AM-PAC PT "6 Clicks" Mobility  Outcome Measure  Help needed turning from your back to your side while in a flat bed without using bedrails?: A Little Help needed moving from lying on your back to sitting on the side of a flat bed without using bedrails?: A Little Help needed moving to and from a bed to a chair (including a wheelchair)?: A Little Help needed standing up from a chair using your arms (e.g., wheelchair or bedside chair)?: A Little Help needed to walk in hospital room?: A Little Help needed climbing 3-5 steps with a railing? : A Little 6 Click Score: 18    End of Session Equipment Utilized During Treatment: Gait belt Activity Tolerance: Other (comment)(limited by dizziness) Patient left: in chair;with call bell/phone within reach Nurse Communication: Mobility status PT Visit Diagnosis:  Difficulty in walking, not elsewhere classified (R26.2)     Time: 2130-8657 PT Time Calculation (min) (ACUTE ONLY): 25 min  Charges:  $Gait Training: 8-22 mins $Therapeutic Activity: 8-22 mins                     [name], DPT Acute Rehabilitation Services Office: [phone]   [name] 03/06/2019, 2:29 PM

## 2019-03-07 NOTE — Progress Notes (Signed)
Physical Therapy Treatment Patient Details Name: Samuel Tate MRN: [id number] DOB: Feb 08, 1991 Today's Date: 03/07/2019    History of Present Illness Patient is 28 y.o. male s/p Bil THA anterior approach on 03/01/19 with PMH significant for avascular necrosis of bil hips, HTN, depression, and anxiety.    PT Comments    Pt ambulated in hallway and practiced safe stair technique again.  Pt reports mild dizziness throughout session however pt reports tolerable (compared to previous sessions).  Pt's BP obtained post mobility and elevated (see docflowsheets).  Pt ready from PT standpoint for d/c home.  Pt provided with handouts for stair technique and HEP.    Follow Up Recommendations  Home health PT;Follow surgeon's recommendation for DC plan and follow-up therapies     Equipment Recommendations  Rolling walker with 5" wheels(wide)    Recommendations for Other Services       Precautions / Restrictions Precautions Precautions: Fall Precaution Comments: monitor BP (pt has h/o HTN and was put on a new BP med just prior to surgery) Restrictions Weight Bearing Restrictions: No Other Position/Activity Restrictions: WBAT    Mobility  Bed Mobility Overal bed mobility: Needs Assistance Bed Mobility: Sit to Supine     Supine to sit: Supervision     General bed mobility comments: pt in recliner  Transfers Overall transfer level: Needs assistance Equipment used: Rolling walker (2 wheeled) Transfers: Sit to/from Stand Sit to Stand: Supervision Stand pivot transfers: Min guard       General transfer comment: good technique, close supervision for safety, pt reports mild dizziness however improved since yesterday  Ambulation/Gait Ambulation/Gait assistance: Min guard;Supervision Gait Distance (Feet): 40 Feet Assistive device: Rolling walker (2 wheeled) Gait Pattern/deviations: Step-through pattern;Decreased stride length;Wide base of support Gait velocity: decr   General Gait  Details: slow to mobilize however steady with RW   Stairs Stairs: Yes Stairs assistance: Min guard Stair Management: Step to pattern;Backwards;With walker Number of Stairs: 2 General stair comments: verbal cues for sequence, RW positioning, provided handout   Wheelchair Mobility    Modified Rankin (Stroke Patients Only)       Balance Overall balance assessment: Needs assistance Sitting-balance support: No upper extremity supported;Feet supported Sitting balance-Leahy Scale: Good     Standing balance support: Single extremity supported;Bilateral upper extremity supported;During functional activity                                Cognition Arousal/Alertness: Awake/alert Behavior During Therapy: WFL for tasks assessed/performed Overall Cognitive Status: Within Functional Limits for tasks assessed                                        Exercises      General Comments        Pertinent Vitals/Pain Pain Assessment: 0-10 Pain Score: 5  Pain Location: B hips Pain Descriptors / Indicators: Sore;Guarding;Grimacing Pain Intervention(s): Repositioned;Monitored during session    Home Living                      Prior Function            PT Goals (current goals can now be found in the care plan section) Acute Rehab PT Goals Patient Stated Goal: go home Progress towards PT goals: Progressing toward goals    Frequency    7X/week  PT Plan Current plan remains appropriate    Co-evaluation              AM-PAC PT "6 Clicks" Mobility   Outcome Measure  Help needed turning from your back to your side while in a flat bed without using bedrails?: A Little Help needed moving from lying on your back to sitting on the side of a flat bed without using bedrails?: A Little Help needed moving to and from a bed to a chair (including a wheelchair)?: A Little Help needed standing up from a chair using your arms (Tate.g., wheelchair  or bedside chair)?: A Little Help needed to walk in hospital room?: A Little Help needed climbing 3-5 steps with a railing? : A Little 6 Click Score: 18    End of Session Equipment Utilized During Treatment: Gait belt Activity Tolerance: Patient tolerated treatment well Patient left: in chair;with call bell/phone within reach   PT Visit Diagnosis: Difficulty in walking, not elsewhere classified (R26.2)     Time: 9767-3419 PT Time Calculation (min) (ACUTE ONLY): 20 min  Charges:  $Gait Training: 8-22 mins                     Samuel Tate, DPT Acute Rehabilitation Services Office: [phone]  Samuel Tate 03/07/2019, 1:23 PM

## 2019-03-07 NOTE — Progress Notes (Signed)
Occupational Therapy Treatment Patient Details Name: Samuel Tate MRN: [id number] DOB: [birth year] Today's Date: 03/07/2019    History of present illness Patient is 28 y.o. male s/p Bil THA anterior approach on 03/01/19 with PMH significant for avascular necrosis of bil hips, HTN, depression, and anxiety.   OT comments  Pt making progress with functional goals. Per RN, BO meds being held due to low BP yesterday. Pt sat EOB with sup and stood to TW min guard A. Educated.reviewd ADL A/E for home use for LB selfcare. Pt reported minimal dizziness upon stnading. OT will continue to follow acutely BP Sitting EOB 143/84 Standing at RW 128/99  Follow Up Recommendations  Home health OT;Supervision/Assistance - 24 hour    Equipment Recommendations  3 in 1 bedside commode;Tub/shower bench;Other (comment)(reacher, sock aid, LH bath sponge)    Recommendations for Other Services      Precautions / Restrictions Precautions Precautions: Fall Precaution Comments: monitor BP (pt has h/o HTN and was put on a new BP med just prior to surgery) Restrictions Weight Bearing Restrictions: No Other Position/Activity Restrictions: WBAT       Mobility Bed Mobility Overal bed mobility: Needs Assistance Bed Mobility: Sit to Supine     Supine to sit: Supervision        Transfers Overall transfer level: Needs assistance Equipment used: Rolling walker (2 wheeled) Transfers: Sit to/from Stand Sit to Stand: Min guard Stand pivot transfers: Min guard       General transfer comment: reoports minimal dizziness upon standing, BP 128/99    Balance Overall balance assessment: Needs assistance Sitting-balance support: No upper extremity supported;Feet supported Sitting balance-Leahy Scale: Good     Standing balance support: Single extremity supported;Bilateral upper extremity supported;During functional activity                               ADL either performed or assessed with  clinical judgement   ADL Overall ADL's : Needs assistance/impaired             Lower Body Bathing: Moderate assistance;Sitting/lateral leans;Sit to/from stand Lower Body Bathing Details (indicate cue type and reason): simulated, verbal education/review ADL A/D for home use     Lower Body Dressing: Minimal assistance;Sit to/from stand Lower Body Dressing Details (indicate cue type and reason): simulated, verbal education/review ADL A/D for home use Toilet Transfer: Min guard;Ambulation;RW Toilet Transfer Details (indicate cue type and reason): simulated from EOB to recliner Toileting- Clothing Manipulation and Hygiene: Min guard;Sit to/from stand       Functional mobility during ADLs: Min guard;Rolling walker       Vision Patient Visual Report: No change from baseline     Perception     Praxis      Cognition Arousal/Alertness: Awake/alert Behavior During Therapy: WFL for tasks assessed/performed Overall Cognitive Status: Within Functional Limits for tasks assessed                                          Exercises     Shoulder Instructions       General Comments      Pertinent Vitals/ Pain       Pain Assessment: 0-10 Pain Score: 5  Pain Location: B hips Pain Descriptors / Indicators: Sore;Guarding;Grimacing Pain Intervention(s): Monitored during session;Repositioned  Home Living  Prior Functioning/Environment              Frequency  Min 2X/week        Progress Toward Goals  OT Goals(current goals can now be found in the care plan section)  Progress towards OT goals: Progressing toward goals  Acute Rehab OT Goals Patient Stated Goal: go home  Plan Discharge plan remains appropriate    Co-evaluation                 AM-PAC OT "6 Clicks" Daily Activity     Outcome Measure   Help from another person eating meals?: None Help from another person taking care  of personal grooming?: A Little Help from another person toileting, which includes using toliet, bedpan, or urinal?: A Little Help from another person bathing (including washing, rinsing, drying)?: A Lot Help from another person to put on and taking off regular upper body clothing?: None Help from another person to put on and taking off regular lower body clothing?: A Lot 6 Click Score: 18    End of Session Equipment Utilized During Treatment: Gait belt;Rolling walker  OT Visit Diagnosis: Unsteadiness on feet (R26.81);Pain Pain - Right/Left: (bilaterally) Pain - part of body: Hip   Activity Tolerance Patient tolerated treatment well   Patient Left in chair;with call bell/phone within reach   Nurse Communication          Time: 3016-0109 OT Time Calculation (min): 21 min  Charges: OT General Charges $OT Visit: 1 Visit OT Treatments $Therapeutic Activity: 8-22 mins     Galen Manila 03/07/2019, 11:26 AM

## 2019-03-07 NOTE — Plan of Care (Signed)
Patient was discharged home today. All questions were answered, and patient was taken to main entrance via wheelchair.

## 2019-03-07 NOTE — Progress Notes (Signed)
Patient ID: Samuel Tate, male   DOB: 1991/03/20, 28 y.o.   MRN: [id number] He has done much better today and his vitals look better.  He tolerated his morning therapy session.  Given the tough last few days he has had, I do feel it is worth making sure that he has a full good day today before safely sending him home tomorrow morning.  He agrees with this as well.

## 2019-03-18 ENCOUNTER — Ambulatory Visit (INDEPENDENT_AMBULATORY_CARE_PROVIDER_SITE_OTHER): Payer: 59 | Admitting: Orthopaedic Surgery

## 2019-03-18 ENCOUNTER — Other Ambulatory Visit: Payer: Self-pay

## 2019-03-18 ENCOUNTER — Encounter: Payer: Self-pay | Admitting: Orthopaedic Surgery

## 2019-03-18 DIAGNOSIS — Z96643 Presence of artificial hip joint, bilateral: Secondary | ICD-10-CM

## 2019-03-18 NOTE — Progress Notes (Signed)
The patient is status post bilateral total hip arthroplasties to treat the pain from end-stage avascular necrosis of both hips.  He is only 28 years old.  He is ambulate with a walker and reports that he is doing well overall.  He is only taking Tylenol as well as Robaxin and aspirin.  On exam both hips have had the staples removed and Steri-Strips applied.  His leg lengths appear equal.  He is standing more upright and is taking much smoother steps from when I saw him preoperative.  He will continue increase his activities as he tolerates and comfort allows.  I will try to get his home health extended for 2-4 more weeks given the fact that this was bilateral total hip arthroplasties.  We talked about his blood pressure medications and getting him with a primary care physician in [place].  All question concerns were answered addressed.  We will see him back in 4 weeks to see how he is doing overall but no x-rays are needed.

## 2019-04-12 ENCOUNTER — Encounter: Payer: Self-pay | Admitting: Registered Nurse

## 2019-04-12 ENCOUNTER — Other Ambulatory Visit: Payer: Self-pay

## 2019-04-12 ENCOUNTER — Ambulatory Visit: Payer: 59 | Admitting: Registered Nurse

## 2019-04-12 VITALS — BP 146/93 | HR 83 | Temp 97.1°F | Resp 16 | Ht 66.0 in | Wt 246.6 lb

## 2019-04-12 DIAGNOSIS — I10 Essential (primary) hypertension: Secondary | ICD-10-CM | POA: Diagnosis not present

## 2019-04-12 DIAGNOSIS — K219 Gastro-esophageal reflux disease without esophagitis: Secondary | ICD-10-CM

## 2019-04-12 DIAGNOSIS — Z7689 Persons encountering health services in other specified circumstances: Secondary | ICD-10-CM

## 2019-04-12 DIAGNOSIS — F419 Anxiety disorder, unspecified: Secondary | ICD-10-CM

## 2019-04-12 DIAGNOSIS — F329 Major depressive disorder, single episode, unspecified: Secondary | ICD-10-CM

## 2019-04-12 DIAGNOSIS — Z1329 Encounter for screening for other suspected endocrine disorder: Secondary | ICD-10-CM

## 2019-04-12 DIAGNOSIS — Z130 Encounter for screening for diseases of the blood and blood-forming organs and certain disorders involving the immune mechanism: Secondary | ICD-10-CM

## 2019-04-12 DIAGNOSIS — Z13220 Encounter for screening for lipoid disorders: Secondary | ICD-10-CM

## 2019-04-12 DIAGNOSIS — Z13228 Encounter for screening for other metabolic disorders: Secondary | ICD-10-CM

## 2019-04-12 MED ORDER — HYDROCHLOROTHIAZIDE 50 MG PO TABS: 50.0000 mg | ORAL_TABLET | Freq: Every day | ORAL | 3 refills | Status: DC

## 2019-04-12 MED ORDER — LOSARTAN POTASSIUM 100 MG PO TABS: 100.0000 mg | ORAL_TABLET | Freq: Every day | ORAL | 3 refills | Status: DC

## 2019-04-12 MED ORDER — FLUOXETINE HCL 20 MG PO CAPS: 20.0000 mg | ORAL_CAPSULE | Freq: Every day | ORAL | 3 refills | Status: DC

## 2019-04-12 MED ORDER — OMEPRAZOLE 20 MG PO CPDR: 20.0000 mg | DELAYED_RELEASE_CAPSULE | Freq: Every day | ORAL | 3 refills | Status: DC

## 2019-04-12 MED ORDER — QUETIAPINE FUMARATE 100 MG PO TABS: 100.0000 mg | ORAL_TABLET | Freq: Every day | ORAL | 3 refills | Status: DC

## 2019-04-12 NOTE — Progress Notes (Signed)
New Patient Office Visit  Subjective:  Patient ID: Samuel Tate, male    DOB: Mar 14, 1991  Age: 29 y.o. MRN: [id number]  CC:  Chief Complaint  Patient presents with  . New Patient (Initial Visit)    establish care no other conerns.    HPI Samuel Tate presents for visit to establish care.  No complaints.  Recently seen by Dr. Magnus Ivan for bilat total hip arthoplasty due to avascular necrosis.  Recently moved to [place] from [place].   Needs refills on medications: Losartan-HCTZ, fluoxetine, seroquel, and prilosec. These medications are working well. He takes his BP at home and notes it's been at 140s/90s. No CV symptoms including headache, shob, doe, visual changes, dependent edema, or chest pain.  Past Medical History:  Diagnosis Date  . Anxiety   . Depression   . Dyspnea    with panic attacks. uses inhaler  . Hypertension     Past Surgical History:  Procedure Laterality Date  . BILATERAL ANTERIOR TOTAL HIP ARTHROPLASTY Bilateral 03/01/2019   Procedure: BILATERAL ANTERIOR TOTAL HIP ARTHROPLASTY;  Surgeon: Kathryne Hitch, MD;  Location: [facility];  Service: Orthopedics;  Laterality: Bilateral;  . FRACTURE SURGERY Right 2010    Family History  Problem Relation Age of Onset  . Healthy Mother   . Healthy Father     Social History   Socioeconomic History  . Marital status: Significant Other    Spouse name: Not on file  . Number of children: 0  . Years of education: Not on file  . Highest education level: Not on file  Occupational History  . Occupation: unemployed  Tobacco Use  . Smoking status: Current Some Day Smoker    Years: 4.00    Types: Cigars  . Smokeless tobacco: Current User    Types: Snuff  Substance and Sexual Activity  . Alcohol use: Yes    Alcohol/week: 6.0 standard drinks    Types: 6 Standard drinks or equivalent per week  . Drug use: Never  . Sexual activity: Not on file  Other Topics Concern  . Not on file  Social History Narrative  .  Not on file   Social Determinants of Health   Financial Resource Strain:   . Difficulty of Paying Living Expenses: Not on file  Food Insecurity:   . Worried About Programme researcher, broadcasting/film/video in the Last Year: Not on file  . Ran Out of Food in the Last Year: Not on file  Transportation Needs:   . Lack of Transportation (Medical): Not on file  . Lack of Transportation (Non-Medical): Not on file  Physical Activity:   . Days of Exercise per Week: Not on file  . Minutes of Exercise per Session: Not on file  Stress:   . Feeling of Stress : Not on file  Social Connections:   . Frequency of Communication with Friends and Family: Not on file  . Frequency of Social Gatherings with Friends and Family: Not on file  . Attends Religious Services: Not on file  . Active Member of Clubs or Organizations: Not on file  . Attends Banker Meetings: Not on file  . Marital Status: Not on file  Intimate Partner Violence:   . Fear of Current or Ex-Partner: Not on file  . Emotionally Abused: Not on file  . Physically Abused: Not on file  . Sexually Abused: Not on file    ROS Review of Systems  Constitutional: Negative.   HENT: Negative.   Eyes: Negative.  Respiratory: Negative.   Cardiovascular: Negative.   Gastrointestinal: Negative.   Endocrine: Negative.   Genitourinary: Negative.   Musculoskeletal: Negative.   Skin: Negative.   Allergic/Immunologic: Negative.   Neurological: Negative.   Hematological: Negative.   Psychiatric/Behavioral: Negative.   All other systems reviewed and are negative.   Objective:   Today's Vitals: BP (!) 146/93   Pulse 83   Temp (!) 97.1 F (36.2 C) (Temporal)   Resp 16   Ht 5\' 6"  (1.676 m)   Wt 246 lb 9.6 oz (111.9 kg)   SpO2 95%   BMI 39.80 kg/m   Physical Exam Vitals and nursing note reviewed.  Constitutional:      General: He is not in acute distress.    Appearance: Normal appearance. He is obese. He is not ill-appearing,  toxic-appearing or diaphoretic.  Cardiovascular:     Rate and Rhythm: Normal rate and regular rhythm.  Pulmonary:     Effort: Pulmonary effort is normal. No respiratory distress.  Musculoskeletal:     Cervical back: Normal range of motion and neck supple.  Neurological:     General: No focal deficit present.     Mental Status: He is alert and oriented to person, place, and time. Mental status is at baseline.  Psychiatric:        Mood and Affect: Mood normal.        Behavior: Behavior normal.        Thought Content: Thought content normal.        Judgment: Judgment normal.     Assessment & Plan:   Problem List Items Addressed This Visit    None    Visit Diagnoses    Screening for endocrine, metabolic and immunity disorder    -  Primary   Relevant Orders   CBC With Differential   Comprehensive metabolic panel   TSH   Lipid screening       Relevant Orders   Lipid panel      Outpatient Encounter Medications as of 04/12/2019  Medication Sig  . albuterol (VENTOLIN HFA) 108 (90 Base) MCG/ACT inhaler Inhale 1-2 puffs into the lungs every 6 (six) hours as needed for wheezing or shortness of breath.  04/14/2019 alendronate (FOSAMAX) 70 MG tablet Take 70 mg by mouth once a week. Take with a full glass of water on an empty stomach.  . diphenhydramine-acetaminophen (TYLENOL PM) 25-500 MG TABS tablet Take 2 tablets by mouth at bedtime as needed (sleep/pain).  Marland Kitchen FLUoxetine (PROZAC) 20 MG capsule Take 20 mg by mouth daily.  Marland Kitchen losartan-hydrochlorothiazide (HYZAAR) 100-25 MG tablet Take 1 tablet by mouth daily.  Marland Kitchen omeprazole (PRILOSEC) 20 MG capsule Take 20 mg by mouth daily.  . QUEtiapine (SEROQUEL) 100 MG tablet Take 100 mg by mouth at bedtime.  Marland Kitchen aspirin 81 MG chewable tablet Chew 1 tablet (81 mg total) by mouth 2 (two) times daily. (Patient not taking: Reported on 04/12/2019)  . methocarbamol (ROBAXIN) 500 MG tablet Take 1 tablet (500 mg total) by mouth every 6 (six) hours as needed for muscle  spasms. (Patient not taking: Reported on 04/12/2019)   No facility-administered encounter medications on file as of 04/12/2019.    Follow-up: No follow-ups on file.   PLAN  Refill prozac, seroquel, and prilosec  Increase HCTZ to 50, maintain losartan at 100. He will continue to check home bp and return to clinic if BP remains elevated  Labs drawn, will follow up as warranted  Encouraged patient to follow up for  CPE at his convenience.  Patient encouraged to call clinic with any questions, comments, or concerns.   Maximiano Coss, NP

## 2019-04-12 NOTE — Patient Instructions (Signed)
° ° ° °  If you have lab work done today you will be contacted with your lab results within the next 2 weeks.  If you have not heard from us then please contact us. The fastest way to get your results is to register for My Chart. ° ° °IF you received an x-ray today, you will receive an invoice from [facility]. Please contact [facility] at [phone] with questions or concerns regarding your invoice.  ° °IF you received labwork today, you will receive an invoice from [facility]. Please contact [facility] at [phone] with questions or concerns regarding your invoice.  ° °Our billing staff will not be able to assist you with questions regarding bills from these companies. ° °You will be contacted with the lab results as soon as they are available. The fastest way to get your results is to activate your My Chart account. Instructions are located on the last page of this paperwork. If you have not heard from us regarding the results in 2 weeks, please contact this office. °  ° ° ° °

## 2019-04-13 LAB — COMPREHENSIVE METABOLIC PANEL
ALT: 45 IU/L — ABNORMAL HIGH (ref 0–44)
AST: 28 IU/L (ref 0–40)
Albumin/Globulin Ratio: 1.5 (ref 1.2–2.2)
Albumin: 4.8 g/dL (ref 4.1–5.2)
Alkaline Phosphatase: 165 IU/L — ABNORMAL HIGH (ref 39–117)
BUN/Creatinine Ratio: 11 (ref 9–20)
BUN: 10 mg/dL (ref 6–20)
Bilirubin Total: 1.1 mg/dL (ref 0.0–1.2)
CO2: 29 mmol/L (ref 20–29)
Calcium: 9.9 mg/dL (ref 8.7–10.2)
Chloride: 98 mmol/L (ref 96–106)
Creatinine, Ser: 0.93 mg/dL (ref 0.76–1.27)
GFR calc Af Amer: 128 mL/min/{1.73_m2} (ref >59)
GFR calc non Af Amer: 111 mL/min/{1.73_m2} (ref >59)
Globulin, Total: 3.2 g/dL (ref 1.5–4.5)
Glucose: 88 mg/dL (ref 65–99)
Potassium: 4.1 mmol/L (ref 3.5–5.2)
Sodium: 141 mmol/L (ref 134–144)
Total Protein: 8 g/dL (ref 6.0–8.5)

## 2019-04-13 LAB — CBC WITH DIFFERENTIAL
Basophils Absolute: 0 10*3/uL (ref 0.0–0.2)
Basos: 1 %
EOS (ABSOLUTE): 0.1 10*3/uL (ref 0.0–0.4)
Eos: 2 %
Hematocrit: 44.7 % (ref 37.5–51.0)
Hemoglobin: 14.3 g/dL (ref 13.0–17.7)
Immature Grans (Abs): 0 10*3/uL (ref 0.0–0.1)
Immature Granulocytes: 1 %
Lymphocytes Absolute: 1.1 10*3/uL (ref 0.7–3.1)
Lymphs: 17 %
MCH: 28.7 pg (ref 26.6–33.0)
MCHC: 32 g/dL (ref 31.5–35.7)
MCV: 90 fL (ref 79–97)
Monocytes Absolute: 0.5 10*3/uL (ref 0.1–0.9)
Monocytes: 8 %
Neutrophils Absolute: 4.8 10*3/uL (ref 1.4–7.0)
Neutrophils: 71 %
RBC: 4.99 x10E6/uL (ref 4.14–5.80)
RDW: 12.6 % (ref 11.6–15.4)
WBC: 6.6 10*3/uL (ref 3.4–10.8)

## 2019-04-13 LAB — LIPID PANEL
Chol/HDL Ratio: 3.4 ratio (ref 0.0–5.0)
Cholesterol, Total: 137 mg/dL (ref 100–199)
HDL: 40 mg/dL (ref >39)
LDL Chol Calc (NIH): 72 mg/dL (ref 0–99)
Triglycerides: 143 mg/dL (ref 0–149)
VLDL Cholesterol Cal: 25 mg/dL (ref 5–40)

## 2019-04-13 LAB — TSH: TSH: 2.21 u[IU]/mL (ref 0.450–4.500)

## 2019-04-15 ENCOUNTER — Other Ambulatory Visit: Payer: Self-pay

## 2019-04-15 ENCOUNTER — Ambulatory Visit (INDEPENDENT_AMBULATORY_CARE_PROVIDER_SITE_OTHER): Payer: 59 | Admitting: Orthopaedic Surgery

## 2019-04-15 ENCOUNTER — Encounter: Payer: Self-pay | Admitting: Orthopaedic Surgery

## 2019-04-15 ENCOUNTER — Encounter: Payer: Self-pay | Admitting: Registered Nurse

## 2019-04-15 DIAGNOSIS — Z96643 Presence of artificial hip joint, bilateral: Secondary | ICD-10-CM

## 2019-04-15 NOTE — Progress Notes (Signed)
HPI: Samuel Tate returns today 6 weeks 3 days status post bilateral hip replacements.  Overall doing well.  He does ambulate with a cane when out of the hand.  He has had no chest pain shortness of breath fevers chills.  States his blood pressure is under better control.  He is taking no pain medications at this time.  Reports incisions are well-healed no signs of drainage or redness.  Physical exam: Bilateral hips good range of motion of both hips without pain.  Calf supple nontender.  Dorsiflexion plantarflexion ankles intact bilaterally  Impression: Status post bilateral total hip arthroplasties 03/01/2019  Plan: He will follow up with [place] in 4 months we will get an AP pelvis and lateral view of both hips at that time.  He will follow up with [place] sooner if there is any questions concerns.  He does have some upcoming travel to [place] and he will be flying I did talk to him about getting up and moving about the cabin is much as possible moving his legs throughout the flight also use compression hose.  Questions were encouraged and answered.

## 2019-05-25 ENCOUNTER — Ambulatory Visit: Payer: 59

## 2019-06-06 ENCOUNTER — Ambulatory Visit: Payer: 59 | Attending: Internal Medicine

## 2019-06-06 DIAGNOSIS — Z23 Encounter for immunization: Secondary | ICD-10-CM

## 2019-06-06 NOTE — Progress Notes (Signed)
   Covid-19 Vaccination Clinic  Name:  Sade Mehlhoff    MRN: [id number] DOB: [birth year]  06/06/2019  Mr. Mcnear was observed post Covid-19 immunization for 15 minutes without incident. He was provided with Vaccine Information Sheet and instruction to access the V-Safe system.   Mr. Treadway was instructed to call 911 with any severe reactions post vaccine: Marland Kitchen Difficulty breathing  . Swelling of face and throat  . A fast heartbeat  . A bad rash all over body  . Dizziness and weakness   Immunizations Administered    Name Date Dose VIS Date Route   Pfizer COVID-19 Vaccine 06/06/2019 10:05 AM 0.3 mL 03/01/2019 Intramuscular   Manufacturer: [employer], [employer]   Lot: [id number]   NDC: [address]-6

## 2019-06-17 ENCOUNTER — Other Ambulatory Visit: Payer: Self-pay

## 2019-06-17 ENCOUNTER — Emergency Department (HOSPITAL_COMMUNITY): Payer: 59

## 2019-06-17 ENCOUNTER — Ambulatory Visit
Admission: EM | Admit: 2019-06-17 | Discharge: 2019-06-17 | Disposition: A | Discharge status: Home / Self Care | Payer: 59 | Source: Home / Self Care

## 2019-06-17 ENCOUNTER — Encounter (HOSPITAL_COMMUNITY): Payer: Self-pay

## 2019-06-17 ENCOUNTER — Encounter: Payer: Self-pay | Admitting: Emergency Medicine

## 2019-06-17 ENCOUNTER — Emergency Department (HOSPITAL_COMMUNITY)
Admission: EM | Admit: 2019-06-17 | Discharge: 2019-06-17 | Disposition: A | Discharge status: Home / Self Care | Payer: 59 | Attending: Emergency Medicine | Admitting: Emergency Medicine

## 2019-06-17 DIAGNOSIS — R519 Headache, unspecified: Secondary | ICD-10-CM | POA: Diagnosis not present

## 2019-06-17 DIAGNOSIS — F17210 Nicotine dependence, cigarettes, uncomplicated: Secondary | ICD-10-CM | POA: Insufficient documentation

## 2019-06-17 DIAGNOSIS — J0191 Acute recurrent sinusitis, unspecified: Secondary | ICD-10-CM | POA: Diagnosis not present

## 2019-06-17 DIAGNOSIS — R9431 Abnormal electrocardiogram [ECG] [EKG]: Secondary | ICD-10-CM | POA: Insufficient documentation

## 2019-06-17 DIAGNOSIS — R0789 Other chest pain: Secondary | ICD-10-CM

## 2019-06-17 DIAGNOSIS — Z79899 Other long term (current) drug therapy: Secondary | ICD-10-CM | POA: Insufficient documentation

## 2019-06-17 DIAGNOSIS — I10 Essential (primary) hypertension: Secondary | ICD-10-CM | POA: Insufficient documentation

## 2019-06-17 DIAGNOSIS — R42 Dizziness and giddiness: Secondary | ICD-10-CM | POA: Diagnosis not present

## 2019-06-17 DIAGNOSIS — R079 Chest pain, unspecified: Secondary | ICD-10-CM

## 2019-06-17 LAB — TROPONIN I (HIGH SENSITIVITY)
Troponin I (High Sensitivity): <2 ng/L (ref <18)
Troponin I (High Sensitivity): <2 ng/L (ref <18)

## 2019-06-17 LAB — BASIC METABOLIC PANEL
Anion gap: 12 (ref 5–15)
BUN: 18 mg/dL (ref 6–20)
CO2: 25 mmol/L (ref 22–32)
Calcium: 9.4 mg/dL (ref 8.9–10.3)
Chloride: 98 mmol/L (ref 98–111)
Creatinine, Ser: 0.89 mg/dL (ref 0.61–1.24)
GFR calc Af Amer: >60 mL/min (ref >60)
GFR calc non Af Amer: >60 mL/min (ref >60)
Glucose, Bld: 120 mg/dL — ABNORMAL HIGH (ref 70–99)
Potassium: 3.7 mmol/L (ref 3.5–5.1)
Sodium: 135 mmol/L (ref 135–145)

## 2019-06-17 LAB — CBC
HCT: 46 % (ref 39.0–52.0)
Hemoglobin: 14.3 g/dL (ref 13.0–17.0)
MCH: 27.6 pg (ref 26.0–34.0)
MCHC: 31.1 g/dL (ref 30.0–36.0)
MCV: 88.6 fL (ref 80.0–100.0)
Platelets: 466 10*3/uL — ABNORMAL HIGH (ref 150–400)
RBC: 5.19 MIL/uL (ref 4.22–5.81)
RDW: 14.1 % (ref 11.5–15.5)
WBC: 9.7 10*3/uL (ref 4.0–10.5)
nRBC: 0 % (ref 0.0–0.2)

## 2019-06-17 MED ORDER — MOMETASONE FUROATE 50 MCG/ACT NA SUSP: 2.0000 | Freq: Every day | NASAL | 1 refills | Status: DC

## 2019-06-17 MED ORDER — CETIRIZINE HCL 10 MG PO TABS: 10.0000 mg | ORAL_TABLET | Freq: Every day | ORAL | 0 refills | Status: DC

## 2019-06-17 MED ORDER — SODIUM CHLORIDE 0.9% FLUSH
3.0000 mL | Freq: Once | INTRAVENOUS | Status: AC
  Administered 2019-06-17: 3 mL via INTRAVENOUS

## 2019-06-17 MED ORDER — SODIUM CHLORIDE 0.9 % IV BOLUS
1000.0000 mL | Freq: Once | INTRAVENOUS | Status: AC
  Administered 2019-06-17: 1000 mL via INTRAVENOUS

## 2019-06-17 MED ORDER — ACETAMINOPHEN 325 MG PO TABS
650.0000 mg | ORAL_TABLET | Freq: Once | ORAL | Status: AC
  Administered 2019-06-17: 18:00:00 650 mg via ORAL
  Filled 2019-06-17: qty 2

## 2019-06-17 NOTE — Discharge Instructions (Signed)
Recommending further evaluation and management in the ED.  Cannot rule out cardiac cause or blood clot in urgent care setting.  Patient aware and in agreement with plan.  Will talk to family and then go to the ED for further evaluation.

## 2019-06-17 NOTE — ED Provider Notes (Signed)
[facility] EMERGENCY DEPARTMENT Provider Note   CSN: [id number] Arrival date & time: 06/17/19  1440     History Chief Complaint  Patient presents with  . Chest Pain    Shafin Coon is a 29 y.o. male.  HPI      Olyn Landstrom is a 29 y.o. male who presents to the Emergency Department complaining of left chest pain that began shortly prior to arrival.  He describes a sharp pain to his left chest associated with headache, diaphoresis, and pain of his bilateral forearms.  He states that he felt as though he was about to "pass out."  He was seen at a local urgent care after onset of symptoms and sent to the ER for further evaluation.  On arrival he states the headache has since improved but he continues to have sensitivity to light and sound and sharp pain to his left chest.  He denies drug use, nausea or vomiting, and previous cardiac history.  No exertional activity prior to onset.  Past Medical History:  Diagnosis Date  . Anxiety   . Depression   . Dyspnea    with panic attacks. uses inhaler  . Hypertension     Patient Active Problem List   Diagnosis Date Noted  . 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 Avascular necrosis of bones of both hips (HCC) 02/28/2019  . Avascular necrosis of bone of hip, left (HCC) 01/03/2019  . Avascular necrosis of bone of hip, right (HCC) 01/03/2019    Past Surgical History:  Procedure Laterality Date  . BILATERAL ANTERIOR TOTAL HIP ARTHROPLASTY Bilateral 03/01/2019   Procedure: BILATERAL ANTERIOR TOTAL HIP ARTHROPLASTY;  Surgeon: Kathryne Hitch, MD;  Location: [facility];  Service: Orthopedics;  Laterality: Bilateral;  . FRACTURE SURGERY Right 2010       Family History  Problem Relation Age of Onset  . Healthy Mother   . Healthy Father     Social History   Tobacco Use  . Smoking status: Current Some Day Smoker    Years: 4.00    Types: Cigars  . Smokeless tobacco: Current User    Types: Snuff  Substance Use  Topics  . Alcohol use: Yes    Alcohol/week: 6.0 standard drinks    Types: 6 Standard drinks or equivalent per week  . Drug use: Never    Home Medications Prior to Admission medications   Medication Sig Start Date End Date Taking? Authorizing Provider  albuterol (VENTOLIN HFA) 108 (90 Base) MCG/ACT inhaler Inhale 1-2 puffs into the lungs every 6 (six) hours as needed for wheezing or shortness of breath.    [provider]  alendronate (FOSAMAX) 70 MG tablet Take 70 mg by mouth once a week. Take with a full glass of water on an empty stomach.    [provider]  diphenhydramine-acetaminophen (TYLENOL PM) 25-500 MG TABS tablet Take 2 tablets by mouth at bedtime as needed (sleep/pain).    [provider]  FLUoxetine (PROZAC) 20 MG capsule Take 1 capsule (20 mg total) by mouth daily. 04/12/19   Janeece Agee, NP  hydrochlorothiazide (HYDRODIURIL) 50 MG tablet Take 1 tablet (50 mg total) by mouth daily. 04/12/19   Janeece Agee, NP  losartan (COZAAR) 100 MG tablet Take 1 tablet (100 mg total) by mouth daily. 04/12/19   Janeece Agee, NP  omeprazole (PRILOSEC) 20 MG capsule Take 1 capsule (20 mg total) by mouth daily. 04/12/19   Janeece Agee, NP  QUEtiapine (SEROQUEL) 100 MG tablet  Take 1 tablet (100 mg total) by mouth at bedtime. 04/12/19   Maximiano Coss, NP  losartan-hydrochlorothiazide (HYZAAR) 100-25 MG tablet Take 1 tablet by mouth daily.  06/17/19  [provider]    Allergies    Patient has no known allergies.  Review of Systems   Review of Systems  Constitutional: Positive for diaphoresis. Negative for activity change, appetite change and fever.  Respiratory: Negative for chest tightness, shortness of breath and wheezing.   Cardiovascular: Positive for chest pain.  Gastrointestinal: Negative for abdominal pain, nausea and vomiting.  Genitourinary: Negative for dysuria and flank pain.  Musculoskeletal: Negative for back pain and neck pain.    Skin: Negative for color change and wound.  Neurological: Positive for headaches. Negative for seizures, syncope, speech difficulty, weakness and numbness.  Psychiatric/Behavioral: Negative for confusion.    Physical Exam Updated Vital Signs BP 114/83 (BP Location: Right Arm)   Pulse (!) 101   Temp 98.2 F (36.8 C)   Resp 16   Ht 5\' 6"  (1.676 m)   Wt 108.9 kg   SpO2 96%   BMI 38.74 kg/m   Physical Exam Vitals and nursing note reviewed.  Constitutional:      General: He is not in acute distress.    Appearance: He is well-developed. He is not ill-appearing.  HENT:     Head: Normocephalic.     Right Ear: Tympanic membrane and ear canal normal.     Left Ear: Tympanic membrane and ear canal normal.     Mouth/Throat:     Mouth: Mucous membranes are moist.     Pharynx: Oropharynx is clear.  Eyes:     Extraocular Movements: Extraocular movements intact.     Conjunctiva/sclera: Conjunctivae normal.     Pupils: Pupils are equal, round, and reactive to light.  Cardiovascular:     Rate and Rhythm: Normal rate and regular rhythm.     Pulses: Normal pulses.  Pulmonary:     Effort: Pulmonary effort is normal.  Abdominal:     General: There is no distension.     Palpations: Abdomen is soft.     Tenderness: There is no abdominal tenderness. There is no guarding.  Musculoskeletal:     Cervical back: Normal range of motion.     Right lower leg: No edema.     Left lower leg: No edema.  Skin:    General: Skin is warm.     Capillary Refill: Capillary refill takes less than 2 seconds.     Findings: No rash.  Neurological:     General: No focal deficit present.     Mental Status: He is alert.     Sensory: No sensory deficit.     Motor: No weakness.     ED Results / Procedures / Treatments   Labs (all labs ordered are listed, but only abnormal results are displayed) Labs Reviewed  BASIC METABOLIC PANEL - Abnormal; Notable for the following components:      Result Value    Glucose, Bld 120 (*)    All other components within normal limits  CBC - Abnormal; Notable for the following components:   Platelets 466 (*)    All other components within normal limits  TROPONIN I (HIGH SENSITIVITY)  TROPONIN I (HIGH SENSITIVITY)    EKG None    ED ECG REPORT   Date: 06/17/2019  Rate: 106  Rhythm: sinus tachycardia  QRS Axis: right  Intervals: normal  ST/T Wave abnormalities: nonspecific T wave changes  Conduction Disutrbances:none  Narrative Interpretation:   Old EKG Reviewed:  EKG read by Dr. Estell Harpin.     Radiology DG Chest 2 View  Result Date: 06/17/2019 CLINICAL DATA:  Diaphoresis, headache, chest pain, visual deficits EXAM: CHEST - 2 VIEW COMPARISON:  None. FINDINGS: The heart size and mediastinal contours are within normal limits. Both lungs are clear. The visualized skeletal structures are unremarkable. IMPRESSION: No active cardiopulmonary disease. Electronically Signed   By: Sharlet Salina M.D.   On: 06/17/2019 15:49    Procedures Procedures (including critical care time)  Medications Ordered in ED Medications  sodium chloride flush (NS) 0.9 % injection 3 mL (3 mLs Intravenous Given 06/17/19 1736)  sodium chloride 0.9 % bolus 1,000 mL (1,000 mLs Intravenous New Bag/Given 06/17/19 1735)  acetaminophen (TYLENOL) tablet 650 mg (650 mg Oral Given 06/17/19 1735)    ED Course  I have reviewed the triage vital signs and the nursing notes.  Pertinent labs & imaging results that were available during my care of the patient were reviewed by me and considered in my medical decision making (see chart for details).    MDM Rules/Calculators/A&P                     Patient here describing left chest pain and near syncope.  Sent here from local urgent care for further evaluation of his chest pain.  Reported headache improved prior to arrival.  Patient reports feeling better after Tylenol and IV fluids.   Orthostatic Lying  BP- Lying: 124/83 Pulse- Lying:  77 Orthostatic Sitting BP- Sitting: 120/85 Pulse- Sitting: 94 Orthostatic Standing at 0 minutes BP- Standing at 0 minutes: 112/82 Pulse- Standing at 0 minutes: 84  On recheck, patient mother is now in the room.  She reports history of recurrent sinusitis and stating that he used to take allergy medications.  Requesting refill.  Cardiac work-up reassuring.  Patient does have some EKG changes, but delta troponin is reassuring.  Patient felt appropriate for discharge home, he agrees to follow-up with PCP and given referral information for cardiology.  Return precautions discussed.   Final Clinical Impression(s) / ED Diagnoses Final diagnoses:  Nonspecific chest pain  Acute nonintractable headache, unspecified headache type  Acute recurrent sinusitis, unspecified location    Rx / DC Orders ED Discharge Orders    None       Rosey Bath 06/18/19 2334    Bethann Berkshire, MD 06/19/19 2320

## 2019-06-17 NOTE — Discharge Instructions (Signed)
Contact the cardiology group listed to arrange a follow-up appointment.  Also, follow-up with your primary care provider for recheck.

## 2019-06-17 NOTE — ED Triage Notes (Signed)
Noticed a brief episode of halos or dots, vision changes lasted 5-10 minutes.  Now vision is normal.  Patient has a headache, same as usual headaches, then had an episode of numbness/tinglin of forearms, this has gone away now.   Chest pain in center of chest.  Pain in center chest is dull, constant.  This started around the same time as everything else.  Prior to onset of symptoms, patient had eaten lunch-"granola bars"

## 2019-06-17 NOTE — ED Triage Notes (Signed)
Pt reports he was at work and started seeing black dots, broke out in sweat, HA and chest pain. Vision is better not, but feels sensitive to light. Cont's to have sharp chest pain

## 2019-06-17 NOTE — ED Provider Notes (Signed)
[place]   [id number] 06/17/19 Arrival Time: 4081   CC: CHEST discomfort  SUBJECTIVE:  Samuel Tate is a 29 y.o. male who presents with complaint of chest discomfort that began 1.5 hours ago.  Denies a precipitating event, trauma, strenuous upper body activities. States he was at work when symptoms occurred.  Coworker told him he looked clammy and pale, but now resolved. Localizes chest pain to the substernal region.  Describes as constant and sharp in character.  Rates pain as 4/10.   Denies alleviating or aggravating factors.  Denies previous symptoms in the past.  Complains of spots in vision, now resolved, ocular migraine, improved, bilateral numbness/ tingling in forearms, resolved, chills, and fatigue.  Denies fever, SOB, nausea, vomiting, abdominal pain, changes in bowel or bladder habits, peripheral edema, or anxiety.    Admits to recent long travel by car to [place] (apx 4-5 hr drive), and had bilateral hip replacements in Dec 2020.  Also admit to occasional cigar use.  Denies SOB, calf pain or swelling, malignancy, hormone use, or previous blood clot  ROS: As per HPI.  All other pertinent ROS negative.    Past Medical History:  Diagnosis Date  . Anxiety   . Depression   . Dyspnea    with panic attacks. uses inhaler  . Hypertension    Past Surgical History:  Procedure Laterality Date  . BILATERAL ANTERIOR TOTAL HIP ARTHROPLASTY Bilateral 03/01/2019   Procedure: BILATERAL ANTERIOR TOTAL HIP ARTHROPLASTY;  Surgeon: Mcarthur Rossetti, MD;  Location: [facility];  Service: Orthopedics;  Laterality: Bilateral;  . FRACTURE SURGERY Right 2010   No Known Allergies No current facility-administered medications on file prior to encounter.   Current Outpatient Medications on File Prior to Encounter  Medication Sig Dispense Refill  . albuterol (VENTOLIN HFA) 108 (90 Base) MCG/ACT inhaler Inhale 1-2 puffs into the lungs every 6 (six) hours as needed for wheezing or shortness  of breath.    Marland Kitchen alendronate (FOSAMAX) 70 MG tablet Take 70 mg by mouth once a week. Take with a full glass of water on an empty stomach.    . diphenhydramine-acetaminophen (TYLENOL PM) 25-500 MG TABS tablet Take 2 tablets by mouth at bedtime as needed (sleep/pain).    Marland Kitchen FLUoxetine (PROZAC) 20 MG capsule Take 1 capsule (20 mg total) by mouth daily. 90 capsule 3  . hydrochlorothiazide (HYDRODIURIL) 50 MG tablet Take 1 tablet (50 mg total) by mouth daily. 90 tablet 3  . losartan (COZAAR) 100 MG tablet Take 1 tablet (100 mg total) by mouth daily. 90 tablet 3  . omeprazole (PRILOSEC) 20 MG capsule Take 1 capsule (20 mg total) by mouth daily. 90 capsule 3  . QUEtiapine (SEROQUEL) 100 MG tablet Take 1 tablet (100 mg total) by mouth at bedtime. 90 tablet 3  . [DISCONTINUED] losartan-hydrochlorothiazide (HYZAAR) 100-25 MG tablet Take 1 tablet by mouth daily.     Social History   Socioeconomic History  . Marital status: Significant Other    Spouse name: Not on file  . Number of children: 0  . Years of education: Not on file  . Highest education level: Not on file  Occupational History  . Occupation: unemployed  Tobacco Use  . Smoking status: Current Some Day Smoker    Years: 4.00    Types: Cigars  . Smokeless tobacco: Current User    Types: Snuff  Substance and Sexual Activity  . Alcohol use: Yes    Alcohol/week: 6.0 standard drinks  Types: 6 Standard drinks or equivalent per week  . Drug use: Never  . Sexual activity: Not on file  Other Topics Concern  . Not on file  Social History Narrative  . Not on file   Social Determinants of Health   Financial Resource Strain:   . Difficulty of Paying Living Expenses:   Food Insecurity:   . Worried About Programme researcher, broadcasting/film/video in the Last Year:   . Barista in the Last Year:   Transportation Needs:   . Freight forwarder (Medical):   Marland Kitchen Lack of Transportation (Non-Medical):   Physical Activity:   . Days of Exercise per Week:   .  Minutes of Exercise per Session:   Stress:   . Feeling of Stress :   Social Connections:   . Frequency of Communication with Friends and Family:   . Frequency of Social Gatherings with Friends and Family:   . Attends Religious Services:   . Active Member of Clubs or Organizations:   . Attends Banker Meetings:   Marland Kitchen Marital Status:   Intimate Partner Violence:   . Fear of Current or Ex-Partner:   . Emotionally Abused:   Marland Kitchen Physically Abused:   . Sexually Abused:    Family History  Problem Relation Age of Onset  . Healthy Mother   . Healthy Father      OBJECTIVE:  Vitals:   06/17/19 1326  BP: 107/72  Pulse: (!) 105  Resp: 18  Temp: 98.5 F (36.9 C)  TempSrc: Oral  SpO2: 96%    General appearance: alert; no distress Eyes: PERRLA; EOMI; conjunctiva normal HENT: normocephalic; atraumatic; oropharynx clear Neck: supple Lungs: clear to auscultation bilaterally without adventitious breath sounds Heart: Tachycardia; no obvious murmurs; radial pulse 2+ and equal bilaterally, regular rhythm  Chest Wall: no heaves, lifts or thrills Abdomen: soft, non-tender; bowel sounds normal; no guarding or rebound tenderness Extremities: no cyanosis or edema; symmetrical with no gross deformities; no calf pain Skin: warm and dry Psychological: alert and cooperative; normal mood and affect  ECG: Orders placed or performed during the hospital encounter of 06/17/19  . ED EKG  . ED EKG   EKG sinus tacycardia without ST elevations, depressions, or prolonged PR interval.  No narrowing or widening of the QRS complexes.  Comparable to past EKG done in Dec 2020, however, QRS changes in V4.    ASSESSMENT & PLAN:  1. Chest discomfort   2. Nonspecific abnormal electrocardiogram (ECG) (EKG)    Recommending further evaluation and management in the ED.  Cannot rule out cardiac cause or blood clot in urgent care setting.  Patient aware and in agreement with plan.  Will talk to family and  then go to the ED for further evaluation.    Rennis Harding, PA-C 06/17/19 1408

## 2019-07-03 ENCOUNTER — Ambulatory Visit: Payer: 59 | Attending: Internal Medicine

## 2019-07-03 DIAGNOSIS — Z23 Encounter for immunization: Secondary | ICD-10-CM

## 2019-07-03 NOTE — Progress Notes (Signed)
   Covid-19 Vaccination Clinic  Name:  Leny Morozov    MRN: [id number] DOB: April 13, 1990  07/03/2019  Mr. Scoville was observed post Covid-19 immunization for 15 minutes without incident. He was provided with Vaccine Information Sheet and instruction to access the V-Safe system.   Mr. Venn was instructed to call 911 with any severe reactions post vaccine: Marland Kitchen Difficulty breathing  . Swelling of face and throat  . A fast heartbeat  . A bad rash all over body  . Dizziness and weakness   Immunizations Administered    Name Date Dose VIS Date Route   Pfizer COVID-19 Vaccine 07/03/2019  8:10 AM 0.3 mL 03/01/2019 Intramuscular   Manufacturer: [employer], [employer]   Lot: [id number]   NDC: [address]-3

## 2019-08-12 ENCOUNTER — Encounter: Payer: Self-pay | Admitting: Orthopaedic Surgery

## 2019-08-12 ENCOUNTER — Ambulatory Visit: Payer: Self-pay

## 2019-08-12 ENCOUNTER — Ambulatory Visit: Payer: 59 | Admitting: Orthopaedic Surgery

## 2019-08-12 ENCOUNTER — Other Ambulatory Visit: Payer: Self-pay

## 2019-08-12 DIAGNOSIS — Z96643 Presence of artificial hip joint, bilateral: Secondary | ICD-10-CM

## 2019-08-12 NOTE — Progress Notes (Signed)
Patient is a very pleasant 28 year old gentleman who is 6 months out from bilateral total hip arthroplasties.  We did this at the same time secondary to significant avascular necrosis of both hips.  He says he is doing well and he has good range of motion and strength and no pain with either hip.  Has been back to work since February and denies any pain or issues at all.  He walks with a normal-appearing gait.  He has full and fluid range of motion of both hips with no pain at all. His leg lengths are equal  An AP pelvis and lateral both hips shows well-seated total hip arthroplasty with no complicating features.  At this point he is doing well so he can follow-up as needed.  I had a long and thorough discussion with him about his hips.  He understands it weight loss is important to joint longevity.  He should also avoid any high impact aerobic activities.  If he does develop any type of aching pain with either hip he needs to let us know.  All questions and concerns were answered and addressed.  Follow-up can be as needed at this point.

## 2020-02-01 ENCOUNTER — Other Ambulatory Visit: Payer: Self-pay

## 2020-02-01 ENCOUNTER — Encounter (HOSPITAL_COMMUNITY): Payer: Self-pay | Admitting: Emergency Medicine

## 2020-02-01 DIAGNOSIS — Z79899 Other long term (current) drug therapy: Secondary | ICD-10-CM | POA: Insufficient documentation

## 2020-02-01 DIAGNOSIS — F4323 Adjustment disorder with mixed anxiety and depressed mood: Secondary | ICD-10-CM | POA: Diagnosis not present

## 2020-02-01 DIAGNOSIS — F332 Major depressive disorder, recurrent severe without psychotic features: Secondary | ICD-10-CM | POA: Insufficient documentation

## 2020-02-01 DIAGNOSIS — F410 Panic disorder [episodic paroxysmal anxiety] without agoraphobia: Secondary | ICD-10-CM | POA: Diagnosis not present

## 2020-02-01 DIAGNOSIS — R45851 Suicidal ideations: Secondary | ICD-10-CM | POA: Insufficient documentation

## 2020-02-01 DIAGNOSIS — F1020 Alcohol dependence, uncomplicated: Secondary | ICD-10-CM | POA: Insufficient documentation

## 2020-02-01 DIAGNOSIS — I10 Essential (primary) hypertension: Secondary | ICD-10-CM | POA: Insufficient documentation

## 2020-02-01 DIAGNOSIS — R000 Tachycardia, unspecified: Secondary | ICD-10-CM | POA: Diagnosis not present

## 2020-02-01 DIAGNOSIS — Z818 Family history of other mental and behavioral disorders: Secondary | ICD-10-CM | POA: Diagnosis not present

## 2020-02-01 DIAGNOSIS — G4700 Insomnia, unspecified: Secondary | ICD-10-CM | POA: Diagnosis not present

## 2020-02-01 DIAGNOSIS — Z96643 Presence of artificial hip joint, bilateral: Secondary | ICD-10-CM | POA: Insufficient documentation

## 2020-02-01 DIAGNOSIS — Z20822 Contact with and (suspected) exposure to covid-19: Secondary | ICD-10-CM | POA: Insufficient documentation

## 2020-02-01 DIAGNOSIS — F17290 Nicotine dependence, other tobacco product, uncomplicated: Secondary | ICD-10-CM | POA: Insufficient documentation

## 2020-02-01 DIAGNOSIS — Z9151 Personal history of suicidal behavior: Secondary | ICD-10-CM | POA: Diagnosis not present

## 2020-02-01 DIAGNOSIS — Z7983 Long term (current) use of bisphosphonates: Secondary | ICD-10-CM | POA: Diagnosis not present

## 2020-02-01 DIAGNOSIS — T464X2A Poisoning by angiotensin-converting-enzyme inhibitors, intentional self-harm, initial encounter: Secondary | ICD-10-CM | POA: Diagnosis not present

## 2020-02-01 DIAGNOSIS — F1010 Alcohol abuse, uncomplicated: Secondary | ICD-10-CM | POA: Diagnosis not present

## 2020-02-01 NOTE — ED Triage Notes (Signed)
Pt here by [facility] after being called out for a drug overdose; pt reports SI since he was a teenager; he reports he has not been taking his meds in months and reports he was on a high BP med and acid reflux medication; pt reports he took all of his home meds in hopes he would die; pt also admits to ETOH consumption today but is unable to recall the amount

## 2020-02-02 ENCOUNTER — Inpatient Hospital Stay (HOSPITAL_COMMUNITY)
Admission: AD | Admit: 2020-02-02 | Discharge: 2020-02-10 | DRG: 885 | Disposition: A | Discharge status: Home / Self Care | Payer: BC Managed Care – PPO | Attending: Psychiatry | Admitting: Psychiatry

## 2020-02-02 ENCOUNTER — Emergency Department (HOSPITAL_COMMUNITY)
Admission: EM | Admit: 2020-02-02 | Discharge: 2020-02-02 | Disposition: A | Discharge status: Home / Self Care | Payer: BC Managed Care – PPO | Source: Home / Self Care | Attending: Emergency Medicine | Admitting: Emergency Medicine

## 2020-02-02 ENCOUNTER — Other Ambulatory Visit: Payer: Self-pay | Admitting: Psychiatry

## 2020-02-02 DIAGNOSIS — F332 Major depressive disorder, recurrent severe without psychotic features: Principal | ICD-10-CM | POA: Diagnosis present

## 2020-02-02 DIAGNOSIS — Z96643 Presence of artificial hip joint, bilateral: Secondary | ICD-10-CM | POA: Diagnosis present

## 2020-02-02 DIAGNOSIS — Z20822 Contact with and (suspected) exposure to covid-19: Secondary | ICD-10-CM | POA: Diagnosis present

## 2020-02-02 DIAGNOSIS — Z818 Family history of other mental and behavioral disorders: Secondary | ICD-10-CM | POA: Diagnosis not present

## 2020-02-02 DIAGNOSIS — F1010 Alcohol abuse, uncomplicated: Secondary | ICD-10-CM | POA: Diagnosis present

## 2020-02-02 DIAGNOSIS — Z79899 Other long term (current) drug therapy: Secondary | ICD-10-CM

## 2020-02-02 DIAGNOSIS — I10 Essential (primary) hypertension: Secondary | ICD-10-CM | POA: Diagnosis present

## 2020-02-02 DIAGNOSIS — Z9151 Personal history of suicidal behavior: Secondary | ICD-10-CM | POA: Diagnosis not present

## 2020-02-02 DIAGNOSIS — Z7983 Long term (current) use of bisphosphonates: Secondary | ICD-10-CM

## 2020-02-02 DIAGNOSIS — F17290 Nicotine dependence, other tobacco product, uncomplicated: Secondary | ICD-10-CM | POA: Diagnosis present

## 2020-02-02 DIAGNOSIS — F4323 Adjustment disorder with mixed anxiety and depressed mood: Secondary | ICD-10-CM | POA: Diagnosis present

## 2020-02-02 DIAGNOSIS — G4700 Insomnia, unspecified: Secondary | ICD-10-CM | POA: Diagnosis present

## 2020-02-02 DIAGNOSIS — T50902A Poisoning by unspecified drugs, medicaments and biological substances, intentional self-harm, initial encounter: Secondary | ICD-10-CM

## 2020-02-02 DIAGNOSIS — F410 Panic disorder [episodic paroxysmal anxiety] without agoraphobia: Secondary | ICD-10-CM | POA: Diagnosis present

## 2020-02-02 DIAGNOSIS — R45851 Suicidal ideations: Secondary | ICD-10-CM

## 2020-02-02 LAB — CBC WITH DIFFERENTIAL/PLATELET
Abs Immature Granulocytes: 0.04 10*3/uL (ref 0.00–0.07)
Basophils Absolute: 0.1 10*3/uL (ref 0.0–0.1)
Basophils Relative: 1 %
Eosinophils Absolute: 0.2 10*3/uL (ref 0.0–0.5)
Eosinophils Relative: 2 %
HCT: 51.1 % (ref 39.0–52.0)
Hemoglobin: 16.4 g/dL (ref 13.0–17.0)
Immature Granulocytes: 0 %
Lymphocytes Relative: 17 %
Lymphs Abs: 1.6 10*3/uL (ref 0.7–4.0)
MCH: 29.1 pg (ref 26.0–34.0)
MCHC: 32.1 g/dL (ref 30.0–36.0)
MCV: 90.6 fL (ref 80.0–100.0)
Monocytes Absolute: 0.8 10*3/uL (ref 0.1–1.0)
Monocytes Relative: 8 %
Neutro Abs: 6.9 10*3/uL (ref 1.7–7.7)
Neutrophils Relative %: 72 %
Platelets: 531 10*3/uL — ABNORMAL HIGH (ref 150–400)
RBC: 5.64 MIL/uL (ref 4.22–5.81)
RDW: 13.5 % (ref 11.5–15.5)
WBC: 9.6 10*3/uL (ref 4.0–10.5)
nRBC: 0 % (ref 0.0–0.2)

## 2020-02-02 LAB — RAPID URINE DRUG SCREEN, HOSP PERFORMED
Amphetamines: NOT DETECTED
Barbiturates: NOT DETECTED
Benzodiazepines: NOT DETECTED
Cocaine: NOT DETECTED
Opiates: NOT DETECTED
Tetrahydrocannabinol: NOT DETECTED

## 2020-02-02 LAB — COMPREHENSIVE METABOLIC PANEL
ALT: 64 U/L — ABNORMAL HIGH (ref 0–44)
AST: 37 U/L (ref 15–41)
Albumin: 4.2 g/dL (ref 3.5–5.0)
Alkaline Phosphatase: 118 U/L (ref 38–126)
Anion gap: 13 (ref 5–15)
BUN: 8 mg/dL (ref 6–20)
CO2: 25 mmol/L (ref 22–32)
Calcium: 9.2 mg/dL (ref 8.9–10.3)
Chloride: 105 mmol/L (ref 98–111)
Creatinine, Ser: 0.9 mg/dL (ref 0.61–1.24)
GFR, Estimated: >60 mL/min (ref >60)
Glucose, Bld: 138 mg/dL — ABNORMAL HIGH (ref 70–99)
Potassium: 3.4 mmol/L — ABNORMAL LOW (ref 3.5–5.1)
Sodium: 143 mmol/L (ref 135–145)
Total Bilirubin: 0.6 mg/dL (ref 0.3–1.2)
Total Protein: 8.5 g/dL — ABNORMAL HIGH (ref 6.5–8.1)

## 2020-02-02 LAB — RESPIRATORY PANEL BY RT PCR (FLU A&B, COVID)
Influenza A by PCR: NEGATIVE
Influenza B by PCR: NEGATIVE
SARS Coronavirus 2 by RT PCR: NEGATIVE

## 2020-02-02 LAB — URINALYSIS, ROUTINE W REFLEX MICROSCOPIC
Bacteria, UA: NONE SEEN
Bilirubin Urine: NEGATIVE
Glucose, UA: NEGATIVE mg/dL
Ketones, ur: NEGATIVE mg/dL
Leukocytes,Ua: NEGATIVE
Nitrite: NEGATIVE
Protein, ur: NEGATIVE mg/dL
Specific Gravity, Urine: 1.004 — ABNORMAL LOW (ref 1.005–1.030)
pH: 7 (ref 5.0–8.0)

## 2020-02-02 LAB — ETHANOL: Alcohol, Ethyl (B): 265 mg/dL — ABNORMAL HIGH (ref <10)

## 2020-02-02 LAB — ACETAMINOPHEN LEVEL
Acetaminophen (Tylenol), Serum: 78 ug/mL — ABNORMAL HIGH (ref 10–30)
Acetaminophen (Tylenol), Serum: 53 ug/mL — ABNORMAL HIGH (ref 10–30)

## 2020-02-02 LAB — SALICYLATE LEVEL: Salicylate Lvl: <7 mg/dL — ABNORMAL LOW (ref 7.0–30.0)

## 2020-02-02 MED ORDER — LORAZEPAM 1 MG PO TABS
1.0000 mg | ORAL_TABLET | Freq: Four times a day (QID) | ORAL | Status: AC | PRN
  Administered 2020-02-02: 1 mg via ORAL
  Filled 2020-02-02 (×2): qty 1

## 2020-02-02 MED ORDER — LORAZEPAM 1 MG PO TABS: 1.0000 mg | ORAL_TABLET | Freq: Three times a day (TID) | ORAL | Status: DC

## 2020-02-02 MED ORDER — LOPERAMIDE HCL 2 MG PO CAPS: 2.0000 mg | ORAL_CAPSULE | ORAL | Status: AC | PRN

## 2020-02-02 MED ORDER — LORAZEPAM 1 MG PO TABS: 1.0000 mg | ORAL_TABLET | Freq: Four times a day (QID) | ORAL | Status: DC

## 2020-02-02 MED ORDER — LORAZEPAM 1 MG PO TABS: 1.0000 mg | ORAL_TABLET | Freq: Two times a day (BID) | ORAL | Status: DC

## 2020-02-02 MED ORDER — LORAZEPAM 1 MG PO TABS: 1.0000 mg | ORAL_TABLET | Freq: Every day | ORAL | Status: DC

## 2020-02-02 MED ORDER — THIAMINE HCL 100 MG/ML IJ SOLN: 100.0000 mg | Freq: Once | INTRAMUSCULAR | Status: DC

## 2020-02-02 MED ORDER — ALUM & MAG HYDROXIDE-SIMETH 200-200-20 MG/5ML PO SUSP: 30.0000 mL | ORAL | Status: DC | PRN

## 2020-02-02 MED ORDER — THIAMINE HCL 100 MG PO TABS
100.0000 mg | ORAL_TABLET | Freq: Every day | ORAL | Status: DC
  Administered 2020-02-03 – 2020-02-10 (×8): 100 mg via ORAL
  Filled 2020-02-02 (×11): qty 1

## 2020-02-02 MED ORDER — ONDANSETRON 4 MG PO TBDP: 4.0000 mg | ORAL_TABLET | Freq: Four times a day (QID) | ORAL | Status: AC | PRN

## 2020-02-02 MED ORDER — LORAZEPAM 1 MG PO TABS
2.0000 mg | ORAL_TABLET | Freq: Once | ORAL | Status: AC
  Administered 2020-02-02: 2 mg via ORAL
  Filled 2020-02-02: qty 2

## 2020-02-02 MED ORDER — TRAZODONE HCL 50 MG PO TABS
50.0000 mg | ORAL_TABLET | Freq: Every evening | ORAL | Status: DC | PRN
  Administered 2020-02-02 – 2020-02-05 (×4): 50 mg via ORAL
  Filled 2020-02-02 (×4): qty 1

## 2020-02-02 MED ORDER — HYDROXYZINE HCL 25 MG PO TABS
25.0000 mg | ORAL_TABLET | Freq: Four times a day (QID) | ORAL | Status: AC | PRN
  Administered 2020-02-02 – 2020-02-05 (×4): 25 mg via ORAL
  Filled 2020-02-02 (×4): qty 1

## 2020-02-02 MED ORDER — ADULT MULTIVITAMIN W/MINERALS CH
1.0000 | ORAL_TABLET | Freq: Every day | ORAL | Status: DC
  Administered 2020-02-03 – 2020-02-10 (×8): 1 via ORAL
  Filled 2020-02-02 (×10): qty 1

## 2020-02-02 MED ORDER — NICOTINE 21 MG/24HR TD PT24
21.0000 mg | MEDICATED_PATCH | Freq: Once | TRANSDERMAL | Status: DC
  Administered 2020-02-02: 21 mg via TRANSDERMAL
  Filled 2020-02-02: qty 1

## 2020-02-02 MED ORDER — SODIUM CHLORIDE 0.9 % IV BOLUS
1000.0000 mL | Freq: Once | INTRAVENOUS | Status: AC
  Administered 2020-02-02: 1000 mL via INTRAVENOUS
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
## 2020-02-02 NOTE — ED Notes (Signed)
Spoke with Patty with [employer]. Recommends: cardiac monitoring for 6 hours, fluids to treat hypotension and or tachycardia, watch for conduction delays, repeat EKG after 4 hours, and repeat tylenol level 4 hours post ingestion. MD notified.

## 2020-02-02 NOTE — ED Notes (Addendum)
Pt suddenly began crying quietly in room. Asked pt what made him upset and if we could do anything to help, pt just shook his head and said no. Pt appears calm and comfortable otherwise.

## 2020-02-02 NOTE — ED Provider Notes (Signed)
[facility] EMERGENCY DEPARTMENT Provider Note   CSN: [id number] Arrival date & time: 02/01/20  2334     History Chief Complaint  Patient presents with  . V70.1    Samuel Tate is a 29 y.o. male.  Patient is a 29 year old male with history of anxiety, depression, and hypertension.  He presents today along with law enforcement for evaluation of overdose/suicidal ideation.  Patient tells me that he "just wants to fucking die".  He reports taking lisinopril, hydrochlorothiazide, and Tylenol because he "wanted to go to sleep and not wake up".  Patient's mother apparently heard of him taking these medications through his cousin and had law enforcement bring him in.  Patient tells me he does not want to be here.  He also reports consuming alcohol earlier this evening.  The history is provided by the patient.       Past Medical History:  Diagnosis Date  . Anxiety   . Depression   . Dyspnea    with panic attacks. uses inhaler  . Hypertension     Patient Active Problem List   Diagnosis Date Noted  . 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 Avascular necrosis of bones of both hips (HCC) 02/28/2019  . Avascular necrosis of bone of hip, left (HCC) 01/03/2019  . Avascular necrosis of bone of hip, right (HCC) 01/03/2019    Past Surgical History:  Procedure Laterality Date  . BILATERAL ANTERIOR TOTAL HIP ARTHROPLASTY Bilateral 03/01/2019   Procedure: BILATERAL ANTERIOR TOTAL HIP ARTHROPLASTY;  Surgeon: Kathryne Hitch, MD;  Location: [facility];  Service: Orthopedics;  Laterality: Bilateral;  . FRACTURE SURGERY Right 2010       Family History  Problem Relation Age of Onset  . Healthy Mother   . Healthy Father     Social History   Tobacco Use  . Smoking status: Current Some Day Smoker    Years: 4.00    Types: Cigars  . Smokeless tobacco: Current User    Types: Snuff  Vaping Use  . Vaping Use: Never used  Substance Use Topics  . Alcohol use: Yes     Alcohol/week: 2.0 - 4.0 standard drinks    Types: 2 - 4 Cans of beer per week    Comment: daily  . Drug use: Never    Home Medications Prior to Admission medications   Medication Sig Start Date End Date Taking? Authorizing Provider  albuterol (VENTOLIN HFA) 108 (90 Base) MCG/ACT inhaler Inhale 1-2 puffs into the lungs every 6 (six) hours as needed for wheezing or shortness of breath.    [provider]  alendronate (FOSAMAX) 70 MG tablet Take 70 mg by mouth once a week. Take with a full glass of water on an empty stomach.    [provider]  cetirizine (ZYRTEC ALLERGY) 10 MG tablet Take 1 tablet (10 mg total) by mouth daily. 06/17/19   Triplett, Tammy, PA-C  diphenhydramine-acetaminophen (TYLENOL PM) 25-500 MG TABS tablet Take 2 tablets by mouth at bedtime as needed (sleep/pain).    [provider]  FLUoxetine (PROZAC) 20 MG capsule Take 1 capsule (20 mg total) by mouth daily. 04/12/19   Janeece Agee, NP  hydrochlorothiazide (HYDRODIURIL) 50 MG tablet Take 1 tablet (50 mg total) by mouth daily. 04/12/19   Janeece Agee, NP  losartan (COZAAR) 100 MG tablet Take 1 tablet (100 mg total) by mouth daily. 04/12/19   Janeece Agee, NP  mometasone (NASONEX) 50 MCG/ACT nasal spray Place 2 sprays into the  nose daily. 06/17/19   Triplett, Tammy, PA-C  omeprazole (PRILOSEC) 20 MG capsule Take 1 capsule (20 mg total) by mouth daily. 04/12/19   Janeece Agee, NP  QUEtiapine (SEROQUEL) 100 MG tablet Take 1 tablet (100 mg total) by mouth at bedtime. 04/12/19   Janeece Agee, NP  losartan-hydrochlorothiazide (HYZAAR) 100-25 MG tablet Take 1 tablet by mouth daily.  06/17/19  [provider]    Allergies    Patient has no known allergies.  Review of Systems   Review of Systems  All other systems reviewed and are negative.   Physical Exam Updated Vital Signs BP (!) 145/121 (BP Location: Right Arm)   Pulse (!) 133   Temp 98.2 F (36.8 C) (Oral)   Resp 18   Ht 5'  6" (1.676 m)   Wt 111.1 kg   SpO2 96%   BMI 39.54 kg/m   Physical Exam Vitals and nursing note reviewed.  Constitutional:      General: He is not in acute distress.    Appearance: He is well-developed. He is not diaphoretic.  HENT:     Head: Normocephalic and atraumatic.  Cardiovascular:     Rate and Rhythm: Normal rate and regular rhythm.     Heart sounds: No murmur heard.  No friction rub.  Pulmonary:     Effort: Pulmonary effort is normal. No respiratory distress.     Breath sounds: Normal breath sounds. No wheezing or rales.  Abdominal:     General: Bowel sounds are normal. There is no distension.     Palpations: Abdomen is soft.     Tenderness: There is no abdominal tenderness.  Musculoskeletal:        General: Normal range of motion.     Cervical back: Normal range of motion and neck supple.  Skin:    General: Skin is warm and dry.  Neurological:     Mental Status: He is alert and oriented to person, place, and time.     Coordination: Coordination normal.  Psychiatric:        Attention and Perception: Attention normal.        Mood and Affect: Affect is blunt and angry.        Speech: Speech normal.        Behavior: Behavior is aggressive.        Thought Content: Thought content includes suicidal ideation. Thought content does not include homicidal ideation. Thought content includes suicidal plan. Thought content does not include homicidal plan.        Cognition and Memory: Cognition normal.        Judgment: Judgment is impulsive.     ED Results / Procedures / Treatments   Labs (all labs ordered are listed, but only abnormal results are displayed) Labs Reviewed  URINALYSIS, ROUTINE W REFLEX MICROSCOPIC - Abnormal; Notable for the following components:      Result Value   Color, Urine STRAW (*)    Specific Gravity, Urine 1.004 (*)    Hgb urine dipstick SMALL (*)    All other components within normal limits  RAPID URINE DRUG SCREEN, HOSP PERFORMED    COMPREHENSIVE METABOLIC PANEL  ETHANOL  ACETAMINOPHEN LEVEL  SALICYLATE LEVEL  CBC WITH DIFFERENTIAL/PLATELET    EKG EKG Interpretation  Date/Time:  Sunday February 02 2020 01:00:12 EST Ventricular Rate:  128 PR Interval:    QRS Duration: 99 QT Interval:  302 QTC Calculation: 441 R Axis:   96 Text Interpretation: Sinus tachycardia Anterior infarct, old Minimal  ST depression, inferior leads No significant change since 06/17/2019 Confirmed by Geoffery Lyons ([id number]) on 02/02/2020 1:21:15 AM   Radiology No results found.  Procedures Procedures (including critical care time)  Medications Ordered in ED Medications  sodium chloride 0.9 % bolus 1,000 mL (1,000 mLs Intravenous New Bag/Given 02/02/20 0052)    ED Course  I have reviewed the triage vital signs and the nursing notes.  Pertinent labs & imaging results that were available during my care of the patient were reviewed by me and considered in my medical decision making (see chart for details).    MDM Rules/Calculators/A&P  Patient presenting here along with law enforcement after taking an overdose of his prescription medications, lisinopril and hydrochlorothiazide.  He also reports to me that he took Tylenol.  He tells me that he did this because he was trying to harm himself.  He tells me he wanted to go to sleep and not wake up.  Patient initially refusing care stating that he did not want examined or connected to the cardiac monitor.  He told me he did not want to be here.  For this reason, I initiated IVC paperwork.  Medical work-up initiated including laboratory studies and acetaminophen/salicylate level.  Initial Tylenol level was 78 with salicylate level undetectable.  This was repeated several hours later and is declining at 53.  I am uncertain as to the exact timing of this patient's overdose, however I am told it had occurred just before arrival.  Patient has been monitored for the recommended period of time and  patient to undergo TTS evaluation.  Final disposition pending.  Final Clinical Impression(s) / ED Diagnoses Final diagnoses:  None    Rx / DC Orders ED Discharge Orders    None       Geoffery Lyons, MD 02/02/20 [phone]

## 2020-02-02 NOTE — BH Assessment (Signed)
Tele Assessment Note   Patient Name: Samuel Tate MRN: [id number] Referring Physician: Geoffery Lyons, MD Location of Patient: [facility], [phone] Location of Provider: Behavioral Health TTS Department  Conall Vangorder is an 29 y.o. male with a diagnosis of depression and anxiety who presents unaccompanied to [facility] ED via law enforcement after Pt overdose on an unknown quantity of lisinopril, hydrochlorothiazide, and Tylenol in a suicide attempt. Pt reports he has experienced depressive symptoms and suicidal thoughts since adolescence. He cannot identify a trigger for acting on suicidal thoughts tonight other than "life." Pt told EDP he "just wants to fucking die" and that he "wanted to go to sleep and not wake up". Per ED record, Pt's mother apparently heard of him taking these medications through his cousin and had law enforcement bring him in. He states he has attempted suicide three times in the past three years by overdosing, attempting to hang himself and jumping off a cliff. He says he has not taken medications in several months. He denies current homicidal ideation or history of violence. He denies history of auditory or visual hallucinations. Pt acknowledges symptoms including crying spells, social withdrawal, loss of interest in usual pleasures, fatigue, irritability, decreased concentration, decreased sleep, decreased appetite and feelings of hopelessness.   Pt reports drinking approximately 4-8 cans of beer daily. He cannot remember how much he drank tonight. Pt's blood alcohol level is 265. He denies history of alcohol withdrawal. He denies other substance use and urine drug screen is negative.  Pt gives brief answers to questions and does not identify specific stressors. He does acknowledge relationship problems and some recent losses. He denies legal problems but says in the past he had a DWI "that was reported as a suicide attempt" and he spent seven days in a treatment center in [place]. He  says he does not have outpatient mental health providers at this time. He states he lives with his mother and stepfather and identifies them as supportive. Pt says he is employed. He denies access to firearms.  Pt is dressed in hospital gown, drowsy and oriented x4. Pt speaks in a slightly slurred tone, at moderate volume and normal pace. Motor behavior appears normal. Eye contact is minimal. Pt's mood is depressed and affect is congruent with mood. Thought process is coherent and relevant. There is no indication Pt is currently responding to internal stimuli or experiencing delusional thought content. Pt says he does not want to be psychiatrically hospitalized. Pt has been placed under involuntary commitment by EDP.   Diagnosis:  F33.2 Major depressive disorder, Recurrent episode, Severe F10.20 Alcohol use disorder, Severe  Past Medical History:  Past Medical History:  Diagnosis Date  . Anxiety   . Depression   . Dyspnea    with panic attacks. uses inhaler  . Hypertension     Past Surgical History:  Procedure Laterality Date  . BILATERAL ANTERIOR TOTAL HIP ARTHROPLASTY Bilateral 03/01/2019   Procedure: BILATERAL ANTERIOR TOTAL HIP ARTHROPLASTY;  Surgeon: Kathryne Hitch, MD;  Location: [facility];  Service: Orthopedics;  Laterality: Bilateral;  . FRACTURE SURGERY Right 2010    Family History:  Family History  Problem Relation Age of Onset  . Healthy Mother   . Healthy Father     Social History:  reports that he has been smoking cigars. He has smoked for the past 4.00 years. His smokeless tobacco use includes snuff. He reports current alcohol use of about 2.0 - 4.0 standard drinks of alcohol per week. He  reports that he does not use drugs.  Additional Social History:  Alcohol / Drug Use Pain Medications: Denies abuse Prescriptions: Denies abuse Over the Counter: Denies abuse History of alcohol / drug use?: Yes Longest period of sobriety (when/how long): Unknown Negative  Consequences of Use: Legal, Personal relationships Withdrawal Symptoms:  (Pt denies) Substance #1 Name of Substance 1: Alcohol 1 - Age of First Use: Adolescent 1 - Amount (size/oz): 4-8 cans of beer 1 - Frequency: Daily 1 - Duration: Ongoing 1 - Last Use / Amount: 02/01/2020  CIWA: CIWA-Ar BP: 126/81 Pulse Rate: (!) 110 COWS:    Allergies: No Known Allergies  Home Medications: (Not in a hospital admission)   OB/GYN Status:  No LMP for male patient.  General Assessment Data Location of Assessment: [facility] ED TTS Assessment: In system Is this a Tele or Face-to-Face Assessment?: Tele Assessment Is this an Initial Assessment or a Re-assessment for this encounter?: Initial Assessment Patient Accompanied by:: N/A Language Other than English: No Living Arrangements: Other (Comment) (Lives with mother, stepfather) What gender do you identify as?: Male Date Telepsych consult ordered in [facility]: 02/02/20 Time Telepsych consult ordered in [facility]: 0509 Marital status: Single Maiden name: NA Pregnancy Status: No Living Arrangements: Parent Can pt return to current living arrangement?: Yes Admission Status: Involuntary Petitioner: ED Attending Is patient capable of signing voluntary admission?: Yes Referral Source: Self/Family/Friend Insurance type: BCBS     Crisis Care Plan Living Arrangements: Parent Legal Guardian: Other: (Self) Name of Psychiatrist: None Name of Therapist: None  Education Status Is patient currently in school?: No Is the patient employed, unemployed or receiving disability?: Employed  Risk to self with the past 6 months Suicidal Ideation: Yes-Currently Present Has patient been a risk to self within the past 6 months prior to admission? : Yes Suicidal Intent: Yes-Currently Present Has patient had any suicidal intent within the past 6 months prior to admission? : Yes Is patient at risk for suicide?: Yes Suicidal Plan?: Yes-Currently Present Has patient had any  suicidal plan within the past 6 months prior to admission? : Yes Specify Current Suicidal Plan: Pt ingested multiple medications in suicide attempt Access to Means: Yes Specify Access to Suicidal Means: Pt ingested multiple medications What has been your use of drugs/alcohol within the last 12 months?: Pt reports drinking alcohol daily Previous Attempts/Gestures: Yes How many times?: 4 Other Self Harm Risks: None Triggers for Past Attempts: Other (Comment) ("Just life") Intentional Self Injurious Behavior: None Family Suicide History: Unknown Recent stressful life event(s): Other (Comment) (Relationship and job stress) Persecutory voices/beliefs?: No Depression: Yes Depression Symptoms: Despondent, Tearfulness, Isolating, Fatigue, Guilt, Loss of interest in usual pleasures, Feeling worthless/self pity, Feeling angry/irritable Substance abuse history and/or treatment for substance abuse?: Yes Suicide prevention information given to non-admitted patients: Not applicable  Risk to Others within the past 6 months Homicidal Ideation: No Does patient have any lifetime risk of violence toward others beyond the six months prior to admission? : No Thoughts of Harm to Others: No Current Homicidal Intent: No Current Homicidal Plan: No Access to Homicidal Means: No Identified Victim: None History of harm to others?: No Assessment of Violence: None Noted Violent Behavior Description: Pt denies history of violence Does patient have access to weapons?: No Criminal Charges Pending?: No Does patient have a court date: No Is patient on probation?: No  Psychosis Hallucinations: None noted Delusions: None noted  Mental Status Report Appearance/Hygiene: In hospital gown Eye Contact: Poor Motor Activity: Freedom of movement Speech: Slow  Level of Consciousness: Drowsy Mood: Depressed Affect: Depressed Anxiety Level: Minimal Thought Processes: Coherent, Relevant Judgement:  Impaired Orientation: Person, Place, Time, Situation Obsessive Compulsive Thoughts/Behaviors: None  Cognitive Functioning Concentration: Decreased Memory: Recent Intact, Remote Intact Is patient IDD: No Insight: Poor Impulse Control: Poor Appetite: Poor Have you had any weight changes? : No Change Sleep: Decreased Total Hours of Sleep: 5 Vegetative Symptoms: None  ADLScreening [facility] Assessment Services) Patient's cognitive ability adequate to safely complete daily activities?: Yes Patient able to express need for assistance with ADLs?: Yes Independently performs ADLs?: Yes (appropriate for developmental age)  Prior Inpatient Therapy Prior Inpatient Therapy: Yes Prior Therapy Dates: Unknown Prior Therapy Facilty/Provider(s): Unknown Reason for Treatment: Suicide attempt, alcohol use  Prior Outpatient Therapy Prior Outpatient Therapy: No Does patient have an ACCT team?: No Does patient have Intensive In-House Services?  : No Does patient have [facility] services? : No Does patient have P4CC services?: No  ADL Screening (condition at time of admission) Patient's cognitive ability adequate to safely complete daily activities?: Yes Is the patient deaf or have difficulty hearing?: No Does the patient have difficulty seeing, even when wearing glasses/contacts?: No Does the patient have difficulty concentrating, remembering, or making decisions?: No Patient able to express need for assistance with ADLs?: Yes Does the patient have difficulty dressing or bathing?: No Independently performs ADLs?: Yes (appropriate for developmental age) Does the patient have difficulty walking or climbing stairs?: No Weakness of Legs: None Weakness of Arms/Hands: None  Home Assistive Devices/Equipment Home Assistive Devices/Equipment: None    Abuse/Neglect Assessment (Assessment to be complete while patient is alone) Abuse/Neglect Assessment Can Be Completed: Yes Physical Abuse: Denies Verbal  Abuse: Denies Sexual Abuse: Denies Exploitation of patient/patient's resources: Denies Self-Neglect: Denies     Merchant navy officer (For Healthcare) Does Patient Have a Medical Advance Directive?: No Would patient like information on creating a medical advance directive?: No - Patient declined          Disposition: Gave clinical report to Otila Back, PA-C who said Pt meets criteria for inpatient dual-diagnosis treatment. Tosin, AC at [facility], said no beds are currently available. Other facilities will be contacted for placement. Notified Dr. Geoffery Lyons and Marcha Solders, RN of recommendation.  Disposition Initial Assessment Completed for this Encounter: Yes  This service was provided via telemedicine using a 2-way, interactive audio and video technology.  Names of all persons participating in this telemedicine service and their role in this encounter. Name: [facility] Role: Patient  Name: Shela Commons, [facility] Role: TTS counselor         Harlin Rain Patsy Baltimore, [facility], [facility] Triage Specialist [phone]  Pamalee Leyden 02/02/2020 5:38 AM

## 2020-02-02 NOTE — ED Notes (Signed)
[facility] at bedside.  

## 2020-02-02 NOTE — ED Notes (Signed)
Communications called to transport pt to [facility].

## 2020-02-02 NOTE — BH Assessment (Signed)
Pt has been accepted to [facility], Room 304-02 to the service of MD Clary.  He is under IVC. Nursing will arrange sheriff transport.  He may arrive after 2000 hours. Nurse report [phone].

## 2020-02-02 NOTE — ED Notes (Signed)
Ok to speak with stepfather Remigio Eisenmenger and mother Clelia Schaumann [phone]

## 2020-02-02 NOTE — ED Notes (Signed)
Pt reports he has had 3 prior suicide attempts in the past 3 years, pt reports he has attempted to hang himself; jump off of a cliff and cut himself. Today's attempt to OD was intentional per pt

## 2020-02-02 NOTE — ED Notes (Signed)
Per mother pt has been very depressed for a while now. She states there have been several deaths in the family in the recent past.

## 2020-02-02 NOTE — ED Notes (Signed)
IVC process explained to pt, pt wanded by security at this time

## 2020-02-02 NOTE — ED Notes (Signed)
Pt c/o mid chest pressure, vitals taken, EKG done. Dr Jacqulyn Bath notified and EKG reviewed. Per Dr Jacqulyn Bath. Ok to transport to [facility].

## 2020-02-02 NOTE — ED Notes (Signed)
Pt resting at this time. Pt denies any needs. Pt has been calm and cooperative.

## 2020-02-03 ENCOUNTER — Encounter (HOSPITAL_COMMUNITY): Payer: Self-pay | Admitting: Psychiatry

## 2020-02-03 DIAGNOSIS — T50902A Poisoning by unspecified drugs, medicaments and biological substances, intentional self-harm, initial encounter: Secondary | ICD-10-CM

## 2020-02-03 DIAGNOSIS — F4323 Adjustment disorder with mixed anxiety and depressed mood: Secondary | ICD-10-CM

## 2020-02-03 LAB — LIPID PANEL
Cholesterol: 154 mg/dL (ref 0–200)
HDL: 42 mg/dL (ref >40)
LDL Cholesterol: 87 mg/dL (ref 0–99)
Total CHOL/HDL Ratio: 3.7 RATIO
Triglycerides: 124 mg/dL (ref <150)
VLDL: 25 mg/dL (ref 0–40)

## 2020-02-03 LAB — HEMOGLOBIN A1C
Hgb A1c MFr Bld: 5 % (ref 4.8–5.6)
Mean Plasma Glucose: 96.8 mg/dL

## 2020-02-03 LAB — TSH: TSH: 1.845 u[IU]/mL (ref 0.350–4.500)

## 2020-02-03 MED ORDER — HYDROCHLOROTHIAZIDE 50 MG PO TABS
50.0000 mg | ORAL_TABLET | Freq: Every day | ORAL | Status: DC
  Administered 2020-02-03 – 2020-02-10 (×8): 50 mg via ORAL
  Filled 2020-02-03 (×10): qty 1

## 2020-02-03 MED ORDER — LOSARTAN POTASSIUM 50 MG PO TABS
100.0000 mg | ORAL_TABLET | Freq: Every day | ORAL | Status: DC
  Administered 2020-02-03 – 2020-02-10 (×8): 100 mg via ORAL
  Filled 2020-02-03 (×10): qty 2

## 2020-02-03 MED ORDER — NICOTINE POLACRILEX 2 MG MT GUM: 2.0000 mg | CHEWING_GUM | OROMUCOSAL | Status: DC | PRN

## 2020-02-03 MED ORDER — VENLAFAXINE HCL ER 37.5 MG PO CP24
37.5000 mg | ORAL_CAPSULE | Freq: Every day | ORAL | Status: DC
  Administered 2020-02-04: 37.5 mg via ORAL
  Filled 2020-02-03 (×3): qty 1

## 2020-02-03 MED ORDER — PANTOPRAZOLE SODIUM 20 MG PO TBEC
20.0000 mg | DELAYED_RELEASE_TABLET | Freq: Every day | ORAL | Status: DC
  Administered 2020-02-03 – 2020-02-10 (×8): 20 mg via ORAL
  Filled 2020-02-03 (×10): qty 1

## 2020-02-03 MED ORDER — NICOTINE 21 MG/24HR TD PT24
21.0000 mg | MEDICATED_PATCH | Freq: Every day | TRANSDERMAL | Status: DC
  Administered 2020-02-03 – 2020-02-10 (×8): 21 mg via TRANSDERMAL
  Filled 2020-02-03 (×11): qty 1

## 2020-02-03 NOTE — Progress Notes (Signed)
D:  Patient's self inventory sheet, patient has poor sleep, no sleep medication.  Poor appetite, low energy level, poor concentration.  Rated depression and hopeless 6, anxiety 3.  Denied withdrawals.  Denied SI.  Denied physical problems.  Denied physical pain.  Goal is none.  No discharge plans. A:  Medications administered per MD orders.  Emotional support and encouragement given patient. R:  Denied SI and HI, contracts for safety.  Denied A/V hallucinations.  Safety maintained with 15 minute checks.

## 2020-02-03 NOTE — Plan of Care (Signed)
Nurse discussed anxiety, depression and coping skills with patient.  

## 2020-02-03 NOTE — [facility] Counselor (Signed)
Adult Comprehensive Assessment  Patient ID: Samuel Tate, male   DOB: Apr 21, 1990, 29 y.o.   MRN: [id number]  Information Source: Information source: Patient  Current Stressors:  Patient states their primary concerns and needs for treatment are:: "A suicide attempt because I am done with this life" Patient states their goals for this hospitilization and ongoing recovery are:: "To get help but I dont think there is any out there" Educational / Learning stressors: Pt reports having a 12th grade education Employment / Job issues: Pt states that he works for [employer] Relationships: Pt reports conflict with his father Surveyor, quantity / Lack of resources (include bankruptcy): Pt reports no stressors Housing / Lack of housing: Pt reports living with his mother and step-father Physical health (include injuries & life threatening diseases): Pt reports no stressors Social relationships: Pt reports few social relationships Substance abuse: Pt reports using alcohol daily and having 3 to 6 drinks a day Bereavement / Loss: Pt reports no stressors  Living/Environment/Situation:  Living Arrangements: Parent, Other relatives Living conditions (as described by patient or guardian): "It is ok I guess" Who else lives in the home?: Mother and step-father How long has patient lived in current situation?: 1 year What is atmosphere in current home: Comfortable, Loving  Family History:  Marital status: Single Are you sexually active?: Yes What is your sexual orientation?: Heterosexual Has your sexual activity been affected by drugs, alcohol, medication, or emotional stress?: Yes Does patient have children?: No  Childhood History:  By whom was/is the patient raised?: Both parents Additional childhood history information: "My father has a short temper" Description of patient's relationship with caregiver when they were a child: "It was good" Patient's description of current relationship with people  who raised him/her: "I hardly talk to my father now" How were you disciplined when you got in trouble as a child/adolescent?: "Spankings and grounding" Does patient have siblings?: Yes Number of Siblings: 1 Description of patient's current relationship with siblings: "I have one brother and we get along pretty good" Did patient suffer any verbal/emotional/physical/sexual abuse as a child?: No Did patient suffer from severe childhood neglect?: No Has patient ever been sexually abused/assaulted/raped as an adolescent or adult?: No Was the patient ever a victim of a crime or a disaster?: No Witnessed domestic violence?: No Has patient been affected by domestic violence as an adult?: No  Education:  Highest grade of school patient has completed: Pt completed 12th grade Currently a student?: No Learning disability?: No  Employment/Work Situation:   Employment situation: Employed 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 How long has patient been employed?: 10 months Patient's job has been impacted by current illness: No What is the longest time patient has a held a job?: 9 years Where was the patient employed at that time?: [employer] Has patient ever been in the [employer]?: No  Financial Resources:   Surveyor, quantity resources: Income from employment, Private insurance Does patient have a representative payee or guardian?: No  Alcohol/Substance Abuse:   What has been your use of drugs/alcohol within the last 12 months?: Pt reports using alcohol daily and having 3 to 6 drinks each day If attempted suicide, did drugs/alcohol play a role in this?: No Alcohol/Substance Abuse Treatment Hx: Past Tx, Inpatient If yes, describe treatment: Pt reports being inpatient for alcohol in [place] Has alcohol/substance abuse ever caused legal problems?: Yes (Pt has had 2 DUI's, last one was approximately 2 years ago)  Social Support System:   [employer] Support System:  None Describe  Community Support System: "I dont have any" Type of faith/religion: None How does patient's faith help to cope with current illness?: None  Leisure/Recreation:   Do You Have Hobbies?: Yes Leisure and Hobbies: "Woodworking with my step-father"  Strengths/Needs:   What is the patient's perception of their strengths?: "I dont know" Patient states they can use these personal strengths during their treatment to contribute to their recovery: Pt did not specify Patient states these barriers may affect/interfere with their treatment: None Patient states these barriers may affect their return to the community: None  Discharge Plan:   Currently receiving community mental health services: No Patient states concerns and preferences for aftercare planning are: Pt declined all follow up at this time Patient states they will know when they are safe and ready for discharge when: Pt declined all follow up at this time Does patient have access to transportation?: Yes Does patient have financial barriers related to discharge medications?: No Will patient be returning to same living situation after discharge?: Yes  Summary/Recommendations:   Summary and Recommendations (to be completed by the evaluator): Samuel Tate is a 29 year old, Caucasian, male who are admitted to the hospital due to anxiety, worsening depression, and SI.  The Pt reports that he lives with his mother and step-father but has conflict with his biological father due to the father's temper.  The Pt reports that he works fulltime, has access to his own transportation, and no financial stressors.  The Pt reports drinking alcohol daily and having 3 to 6 drinks each day.  The Pt reports having previous inpatient admissions due to alcohol use in [place] and 2 previous DUI's. The Pt does not provide details on why he attemped suicide expect to state that he is "done with life".  While in the hospital the Pt can benefit from crisis stabilization,  medication evaluation, group therapy, psycho-education, case management, and discharge planning.  Upon discharge the Pt will return home with his mother and step-father and will follow up at a local agency for therapy and medication management.  Samuel Tate. 02/03/2020
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
## 2020-02-03 NOTE — [facility] Suicide Risk Assessment (Signed)
[facility] INPATIENT:  Family/Significant Other Suicide Prevention Education  Suicide Prevention Education:  Education Completed; Samuel Tate [phone] (Mother) has been identified by the patient as the family member/significant other with whom the patient will be residing, and identified as the person(s) who will aid the patient in the event of a mental health crisis (suicidal ideations/suicide attempt).  With written consent from the patient, the family member/significant other has been provided the following suicide prevention education, prior to the and/or following the discharge of the patient.  The suicide prevention education provided includes the following:  Suicide risk factors  Suicide prevention and interventions  National Suicide Hotline telephone number  [facility] assessment telephone number  [facility] Emergency Assistance 911  [facility] and/or Residential Mobile Crisis Unit telephone number  Request made of family/significant other to:  Remove weapons (e.g., guns, rifles, knives), all items previously/currently identified as safety concern.    Remove drugs/medications (over-the-counter, prescriptions, illicit drugs), all items previously/currently identified as a safety concern.  The family member/significant other verbalizes understanding of the suicide prevention education information provided.  The family member/significant other agrees to remove the items of safety concern listed above.  Mrs. Samuel Tate states that she is not sure what triggered this recent suicide attempt but does states that her son has been depressed for 2 to 3 years.  Mrs. Samuel Tate states that her son does not like to be alone and that when he is not at work there is always someone with him.  Mrs. Samuel Tate states that he does well in relationships until they fall apart and then he does not do well.  Mrs. Samuel Tate states that her son drinks often during the week and doubles it on the weekends  because he is attempting to self medicate.  Mrs. Samuel Tate states that her son does not open up and talk to people about issues that are bothering him and he often isolates when something is wrong.  Mrs. Samuel Tate states that her son has some social anxiety but does get along well with his co-workers.  Mrs. Samuel Tate states that the family has experienced a lot of loss including an uncle and a cousin that Samuel Tate was very close to who had just turned 13 and was shot and killed.  Mrs. Samuel Tate states this made Samuel Tate very upset and he did not cope well with it.  Mrs. Samuel Tate states that she wants her son to be happy but she is not sure what to do.  Mrs. Samuel Tate states that her son has refused therapy in the past and she is not sure how to get him to go to therapy.      Samuel Tate 02/03/2020, 3:08 PM

## 2020-02-03 NOTE — Progress Notes (Signed)
Recreation Therapy Notes  Date:  1.22.20 Time: 0930 Location: [address] Dayroom  Group Topic: Stress Management  Goal Area(s) Addresses:  Patient will identify positive stress management techniques. Patient will identify benefits of using stress management post d/c.  Intervention: Stress Management  Activity: Meditation.  LRT played a meditation that focused on making choices.  Patients were to listen and follow as meditation played to engage in activity.    Education:  Stress Management, Discharge Planning.   Education Outcome: Acknowledges Education  Clinical Observations/Feedback: Pt did not attend activity.    Caroll Rancher, LRT/CTRS         Caroll Rancher A 02/03/2020 11:31 AM

## 2020-02-03 NOTE — [facility] Group Notes (Signed)
[facility] LCSW Group Therapy  02/03/2020 2:20 PM  Type of Therapy:  ABC's of CBT and Triggers  Participation Level:  Did Not Attend  Participation Quality:  Did not attend   Affect:  Did not attend   Cognitive:  Did not attend   Insight:  Did not attend   Engagement in Therapy:  Did not attend   Modes of Intervention:  Did not attend   Summary of Progress/Problems: Pt did not attend the group   Aram Beecham 02/03/2020, 2:20 PM

## 2020-02-03 NOTE — Tx Team (Signed)
Initial Treatment Plan 02/03/2020 5:28 AM [name] FVC:[id number]    PATIENT STRESSORS: Health problems Marital or family conflict Substance abuse   PATIENT STRENGTHS: Ability for insight Communication skills General fund of knowledge Supportive family/friends   PATIENT IDENTIFIED PROBLEMS: Loss of significant relationship  Suicidal Thoughts  Alcohol abuse  Treatment non compliance               DISCHARGE CRITERIA:  Improved stabilization in mood, thinking, and/or behavior Motivation to continue treatment in a less acute level of care Reduction of life-threatening or endangering symptoms to within safe limits Verbal commitment to aftercare and medication compliance  PRELIMINARY DISCHARGE PLAN: Outpatient therapy Return to previous living arrangement  PATIENT/FAMILY INVOLVEMENT: This treatment plan has been presented to and reviewed with the patient, Samuel Tate.  The patient and family have been given the opportunity to ask questions and make suggestions.  Margarita Rana, RN 02/03/2020, 5:28 AM

## 2020-02-03 NOTE — [facility] Suicide Risk Assessment (Signed)
[facility] Admission Suicide Risk Assessment   Nursing information obtained from:  Patient Demographic factors:  Male, Adolescent or young adult Current Mental Status:  Suicidal ideation indicated by patient Loss Factors:  Loss of significant relationship Historical Factors:  Prior suicide attempts, Family history of mental illness or substance abuse, Impulsivity Risk Reduction Factors:  Living with another person, especially a relative  Total Time spent with patient: 20 minutes Principal Problem: MDD (major depressive disorder), recurrent severe, without psychosis (HCC) Diagnosis:  Principal Problem:   MDD (major depressive disorder), recurrent severe, without psychosis (HCC) Active Problems:   Adjustment disorder with mixed anxiety and depressed mood  Data: 29 year old male with recent suicide attempt via overdose  Continued Clinical Symptoms:  Alcohol Use Disorder Identification Test Final Score (AUDIT): 26 The "Alcohol Use Disorders Identification Test", Guidelines for Use in Primary Care, Second Edition.  World Science writer [facility]). Score between 0-7:  no or low risk or alcohol related problems. Score between 8-15:  moderate risk of alcohol related problems. Score between 16-19:  high risk of alcohol related problems. Score 20 or above:  warrants further diagnostic evaluation for alcohol dependence and treatment.   CLINICAL FACTORS:   Depression:   Comorbid alcohol abuse/dependence Hopelessness Insomnia Severe   See HPI for mental status  COGNITIVE FEATURES THAT CONTRIBUTE TO RISK:  Thought constriction (tunnel vision)    SUICIDE RISK:   Moderate:  Frequent suicidal ideation with limited intensity, and duration, some specificity in terms of plans, no associated intent, good self-control, limited dysphoria/symptomatology, some risk factors present, and identifiable protective factors, including available and accessible social support.  PLAN OF CARE: Continue care on  inpatient basis, See HPI for full plan  I certify that inpatient services furnished can reasonably be expected to improve the patient's condition.   [name], MD 02/03/2020, 3:52 PM

## 2020-02-03 NOTE — [facility] Group Notes (Signed)
The focus of this group is to help patients establish daily goals to achieve during treatment and discuss how the patient can incorporate goal setting into their daily lives to aide in recovery.  Pt did not attend group 

## 2020-02-03 NOTE — Progress Notes (Signed)
   02/03/20 2123  COVID-19 Daily Checkoff  Have you had a fever (temp > 37.80C/100F)  in the past 24 hours?  No  COVID-19 EXPOSURE  Have you traveled outside the state in the past 14 days? No  Have you been in contact with someone with a confirmed diagnosis of COVID-19 or PUI in the past 14 days without wearing appropriate PPE? No  Have you been living in the same home as a person with confirmed diagnosis of COVID-19 or a PUI (household contact)? No  Have you been diagnosed with COVID-19? No

## 2020-02-03 NOTE — Tx Team (Signed)
Interdisciplinary Treatment and Diagnostic Plan Update  02/03/2020 Time of Session: 10:00am  Samuel Tate MRN: [id number]  Principal Diagnosis: <principal problem not specified>  Secondary Diagnoses: Active Problems:   MDD (major depressive disorder), recurrent severe, without psychosis (HCC)   Current Medications:  Current Facility-Administered Medications  Medication Dose Route Frequency Provider Last Rate Last Admin  . alum & mag hydroxide-simeth (MAALOX/MYLANTA) 200-200-20 MG/5ML suspension 30 mL  30 mL Oral Q4H PRN Nwoko, Uchenna E, PA      . hydrOXYzine (ATARAX/VISTARIL) tablet 25 mg  25 mg Oral Q6H PRN Nwoko, Uchenna E, PA   25 mg at 02/02/20 2306  . loperamide (IMODIUM) capsule 2-4 mg  2-4 mg Oral PRN Nwoko, Uchenna E, PA      . LORazepam (ATIVAN) tablet 1 mg  1 mg Oral Q6H PRN Nwoko, Uchenna E, PA   1 mg at 02/02/20 2306  . multivitamin with minerals tablet 1 tablet  1 tablet Oral Daily Nwoko, Uchenna E, PA   1 tablet at 02/03/20 0900  . ondansetron (ZOFRAN-ODT) disintegrating tablet 4 mg  4 mg Oral Q6H PRN Nwoko, Uchenna E, PA      . thiamine (B-1) injection 100 mg  100 mg Intramuscular Once Nwoko, Uchenna E, PA      . thiamine tablet 100 mg  100 mg Oral Daily Nwoko, Uchenna E, PA   100 mg at 02/03/20 0900  . traZODone (DESYREL) tablet 50 mg  50 mg Oral QHS PRN Nwoko, Uchenna E, PA   50 mg at 02/02/20 2306   PTA Medications: Medications Prior to Admission  Medication Sig Dispense Refill Last Dose  . albuterol (VENTOLIN HFA) 108 (90 Base) MCG/ACT inhaler Inhale 1-2 puffs into the lungs every 6 (six) hours as needed for wheezing or shortness of breath.     Marland Kitchen alendronate (FOSAMAX) 70 MG tablet Take 70 mg by mouth once a week. Take with a full glass of water on an empty stomach.     . cetirizine (ZYRTEC ALLERGY) 10 MG tablet Take 1 tablet (10 mg total) by mouth daily. 30 tablet 0   . diphenhydramine-acetaminophen (TYLENOL PM) 25-500 MG TABS tablet Take 2 tablets by mouth at bedtime  as needed (sleep/pain).     Marland Kitchen FLUoxetine (PROZAC) 20 MG capsule Take 1 capsule (20 mg total) by mouth daily. 90 capsule 3   . hydrochlorothiazide (HYDRODIURIL) 50 MG tablet Take 1 tablet (50 mg total) by mouth daily. 90 tablet 3   . losartan (COZAAR) 100 MG tablet Take 1 tablet (100 mg total) by mouth daily. 90 tablet 3   . mometasone (NASONEX) 50 MCG/ACT nasal spray Place 2 sprays into the nose daily. 17 g 1   . omeprazole (PRILOSEC) 20 MG capsule Take 1 capsule (20 mg total) by mouth daily. 90 capsule 3   . QUEtiapine (SEROQUEL) 100 MG tablet Take 1 tablet (100 mg total) by mouth at bedtime. 90 tablet 3     Patient Stressors: Health problems Marital or family conflict Substance abuse  Patient Strengths: Ability for Information systems manager fund of knowledge Supportive family/friends  Treatment Modalities: Medication Management, Group therapy, Case management,  1 to 1 session with clinician, Psychoeducation, Recreational therapy.   Physician Treatment Plan for Primary Diagnosis: <principal problem not specified> Long Term Goal(s):     Short Term Goals:    Medication Management: Evaluate patient's response, side effects, and tolerance of medication regimen.  Therapeutic Interventions: 1 to 1 sessions, Unit Group sessions and Medication administration.  Evaluation of Outcomes: Progressing  Physician Treatment Plan for Secondary Diagnosis: Active Problems:   MDD (major depressive disorder), recurrent severe, without psychosis (HCC)  Long Term Goal(s):     Short Term Goals:       Medication Management: Evaluate patient's response, side effects, and tolerance of medication regimen.  Therapeutic Interventions: 1 to 1 sessions, Unit Group sessions and Medication administration.  Evaluation of Outcomes: Progressing   RN Treatment Plan for Primary Diagnosis: <principal problem not specified> Long Term Goal(s): Knowledge of disease and therapeutic regimen to  maintain health will improve  Short Term Goals: Ability to remain free from injury will improve, Ability to demonstrate self-control, Ability to participate in decision making will improve, Ability to verbalize feelings will improve, Ability to disclose and discuss suicidal ideas and Ability to identify and develop effective coping behaviors will improve  Medication Management: RN will administer medications as ordered by provider, will assess and evaluate patient's response and provide education to patient for prescribed medication. RN will report any adverse and/or side effects to prescribing provider.  Therapeutic Interventions: 1 on 1 counseling sessions, Psychoeducation, Medication administration, Evaluate responses to treatment, Monitor vital signs and CBGs as ordered, Perform/monitor CIWA, COWS, AIMS and Fall Risk screenings as ordered, Perform wound care treatments as ordered.  Evaluation of Outcomes: Progressing   LCSW Treatment Plan for Primary Diagnosis: <principal problem not specified> Long Term Goal(s): Safe transition to appropriate next level of care at discharge, Engage patient in therapeutic group addressing interpersonal concerns.  Short Term Goals: Engage patient in aftercare planning with referrals and resources, Increase social support, Increase emotional regulation, Facilitate acceptance of mental health diagnosis and concerns, Facilitate patient progression through stages of change regarding substance use diagnoses and concerns and Identify triggers associated with mental health/substance abuse issues  Therapeutic Interventions: Assess for all discharge needs, 1 to 1 time with Social worker, Explore available resources and support systems, Assess for adequacy in community support network, Educate family and significant other(s) on suicide prevention, Complete Psychosocial Assessment, Interpersonal group therapy.  Evaluation of Outcomes: Progressing   Progress in  Treatment: Attending groups: No. Participating in groups: No. Taking medication as prescribed: Yes. Toleration medication: Yes. Family/Significant other contact made: No, will contact:  If consents are given  Patient understands diagnosis: Yes. and No. Discussing patient identified problems/goals with staff: Yes. Medical problems stabilized or resolved: Yes. Denies suicidal/homicidal ideation: Yes. Issues/concerns per patient self-inventory: No.  New problem(s) identified: No, Describe:  None   New Short Term/Long Term Goal(s): medication stabilization, elimination of SI thoughts, development of comprehensive mental wellness plan.   Patient Goals: Pt did not attend   Discharge Plan or Barriers: Patient recently admitted. CSW will continue to follow and assess for appropriate referrals and possible discharge planning.   Reason for Continuation of Hospitalization: Depression Medication stabilization Suicidal ideation Withdrawal symptoms  Estimated Length of Stay: 3 to 5 days   Attendees: Patient: Pt did not attend  02/03/2020   Physician: Pricilla Larsson, MD 02/03/2020   Nursing:  02/03/2020   RN Care Manager: 02/03/2020   Social Worker: Melba Coon, LCSWA 02/03/2020   Recreational Therapist:  02/03/2020   Other:  02/03/2020   Other:  02/03/2020   Other: 02/03/2020     Scribe for Treatment Team: Aram Beecham, LCSWA 02/03/2020 11:06 AM

## 2020-02-03 NOTE — [facility] Group Notes (Signed)
Occupational Therapy Group Note Date: 02/03/2020 Group Topic/Focus: Stress Management  Group Description: Group encouraged increased participation and engagement through discussion focused on topic of stress management. Patients engaged interactively to discuss components of stress including physical signs, emotional signs, negative management strategies, and positive management strategies. Each individual identified one new stress management strategy they would like to try moving forward.    Therapeutic Goals: Identify current stressors Identify healthy vs unhealthy stress management strategies/techniques Discuss and identify physical and emotional signs of stress Participation Level: Patient did not attend OT group session despite personal invitation.    Plan: Continue to engage patient in OT groups 2 - 3x/week.   02/03/2020  Donne Hazel, MOT, OTR/L

## 2020-02-04 DIAGNOSIS — T50902A Poisoning by unspecified drugs, medicaments and biological substances, intentional self-harm, initial encounter: Secondary | ICD-10-CM | POA: Diagnosis not present

## 2020-02-04 NOTE — [facility] Group Notes (Addendum)
Adult Psychoeducational Group Note  Date:  02/04/2020 Time:  9:24 AM  Group Topic/Focus:  Orientation:   The focus of this group is to educate the patient on the purpose and policies of crisis stabilization and provide a format to answer questions about their admission.  The group details unit policies and expectations of patients while admitted.  Participation Level:  Active  Participation Quality:  Appropriate  Affect:  Appropriate  Cognitive:  Appropriate  Insight: Appropriate  Engagement in Group:  Engaged  Modes of Intervention:  Education  Additional Comments:  Patient attended morning orientation & Psycho-ed  group and participated.    Alfredo Collymore W Niti Leisure 02/04/2020, 9:24 AM

## 2020-02-04 NOTE — Progress Notes (Signed)
Recreation Therapy Notes  Animal-Assisted Activity (AAA) Program Checklist/Progress Notes Patient Eligibility Criteria Checklist & Daily Group note for Rec Tx Intervention  Date: 11.16.21 Time: 1430 Location: [address]   AAA/T Program Assumption of Risk Form signed by Engineer, production or Parent Legal Guardian  YES   Patient is free of allergies or sever asthma  YES  Patient reports no fear of animals YES   Patient reports no history of cruelty to animals YES  Patient understands his/her participation is voluntary  YES   Patient washes hands before animal contact  YES   Patient washes hands after animal contact  YES   Behavioral Response: Engaged  Education: Charity fundraiser, Appropriate Animal Interaction   Education Outcome: Acknowledges understanding/In group clarification offered/Needs additional education.   Clinical Observations/Feedback: Pt attended and participated in group activity.     Caroll Rancher, LRT/CTRS         Caroll Rancher A 02/04/2020 3:38 PM

## 2020-02-04 NOTE — Progress Notes (Signed)
The patient learned today about the services that are offered in the outpatient department. His goal for tomorrow is to have another good day and to try to stay awake.

## 2020-02-04 NOTE — Progress Notes (Signed)
Pt appears sad and depressed tonight. He said that his mood hasn't been so well lately. He is very minimal during the assessment and isn't able to elaborate with his answers. He denies having any withdrawal symptoms. Pt denies SI/HI and AVH. Pt contracts for safety. He agrees to notify staff immediately for any thoughts of hurting himself or anyone else. Active listening, reassurance, and support provided. Medications administered as ordered by MD. Q 15 min safety checks continue. Pt's safety has been maintained.   02/03/20 2123  Psych Admission Type (Psych Patients Only)  Admission Status Involuntary  Psychosocial Assessment  Patient Complaints Anxiety;Depression;Sadness;Worrying;Hopelessness  Eye Contact Fair  Facial Expression Sad;Sullen;Anxious  Affect Anxious;Depressed;Sad;Sullen  Speech Slow  Interaction Minimal;Forwards little;Guarded  Motor Activity Other (Comment) (WNL)  Appearance/Hygiene Unremarkable  Behavior Characteristics Cooperative;Anxious  Mood Depressed;Anxious;Sad;Sullen  Thought Process  Coherency WDL  Content WDL  Delusions None reported or observed  Perception WDL  Hallucination None reported or observed  Judgment Poor  Confusion None  Danger to Self  Current suicidal ideation? Denies  Danger to Others  Danger to Others None reported or observed

## 2020-02-04 NOTE — Progress Notes (Signed)
D:  Patient's self inventory sheet, patient has poor sleep, no sleep medication.  Fair appetite, low energy level, poor concentration.  Rated depression and hopeless 6, anxiety 3.  Denied withdrawals.  Denied SI.  Denied physical problems.  Denied physical pain.  Goal is be up today and attend groups.  Does not want to stay in his room today.  No discharge plans. A:  Medications administered per MD orders.  Emotional support and encouragement given patient. R:  Denied SI and HI, contracts for safety.  Denied A/V hallucinations.  Safety maintained with 15 minute checks.
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
## 2020-02-04 NOTE — Plan of Care (Signed)
Nurse discussed anxiety, depression and coping skills with patient.  

## 2020-02-05 DIAGNOSIS — T50902A Poisoning by unspecified drugs, medicaments and biological substances, intentional self-harm, initial encounter: Secondary | ICD-10-CM | POA: Diagnosis not present

## 2020-02-05 MED ORDER — VENLAFAXINE HCL ER 75 MG PO CP24
75.0000 mg | ORAL_CAPSULE | Freq: Every day | ORAL | Status: DC
  Administered 2020-02-05 – 2020-02-10 (×6): 75 mg via ORAL
  Filled 2020-02-05 (×8): qty 1

## 2020-02-05 NOTE — Progress Notes (Signed)
The patient expressed in group that he had a good day since he went outside for fresh air and because he spoke with the students. He also credited his good day with having gone through meditation with his peers. His goal for tomorrow is to have another good day.

## 2020-02-05 NOTE — [facility] Group Notes (Signed)
[facility] LCSW Group Therapy  02/05/2020 1:45 PM  Type of Therapy:  Group Therapy: Discussion of CBT focused on core beliefs and emotions.  Participation Level:  Active  Participation Quality:  Appropriate and Attentive  Engagement in Therapy:  Engaged  Modes of Intervention:  Discussion and Education  Summary of Progress/Problems: Pt accepted handouts. Pt did not share during the main discussion but did share during the icebreaker. Pt shared that if he could go anywhere in the world he would go to [place] and take his brother.  Samuel Tate 02/05/2020, 1:45 PM

## 2020-02-05 NOTE — Progress Notes (Signed)
   02/05/20 0600  Vital Signs  Temp 98.4 F (36.9 C)  Temp Source Oral  Pulse Rate 82  Pulse Rate Source Dinamap  Resp 18  BP 112/76  BP Location Right Arm  BP Method Automatic  Patient Position (if appropriate) Sitting  Orthostatic Standing at 0 minutes  BP- Standing at 0 minutes (!) 117/99  Pulse- Standing at 0 minutes 100  CIWA-Ar  Nausea and Vomiting 0  Tactile Disturbances 0  Tremor 0  Auditory Disturbances 0  Paroxysmal Sweats 0  Visual Disturbances 0  Anxiety 1  Headache, Fullness in Head 0  Agitation 0  Orientation and Clouding of Sensorium 0  CIWA-Ar Total 1   D: Patient denies SI/HI/AVH. Patient rates anxiety 2/10 and depression 4/10 Patient out in open areas and social with peers and staff.  A:  Patient took scheduled medicine.  Support and encouragement provided Routine safety checks conducted every 15 minutes. Patient  Informed to notify staff with any concerns.   R:  Safety maintained.
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
## 2020-02-05 NOTE — Progress Notes (Signed)
   02/05/20 0000  Psych Admission Type (Psych Patients Only)  Admission Status Involuntary  Psychosocial Assessment  Patient Complaints Anxiety;Depression  Eye Contact Fair  Facial Expression Sad;Sullen;Anxious  Affect Anxious;Depressed;Sad;Sullen  Speech Slow  Interaction Minimal;Forwards little;Guarded  Motor Activity Other (Comment) (WNL)  Appearance/Hygiene Unremarkable  Behavior Characteristics Anxious  Mood Depressed  Thought Process  Coherency WDL  Content WDL  Delusions None reported or observed  Perception WDL  Hallucination None reported or observed  Judgment Poor  Confusion None  Danger to Self  Current suicidal ideation? Denies  Danger to Others  Danger to Others None reported or observed

## 2020-02-06 DIAGNOSIS — T50902A Poisoning by unspecified drugs, medicaments and biological substances, intentional self-harm, initial encounter: Secondary | ICD-10-CM | POA: Diagnosis not present

## 2020-02-06 MED ORDER — HYDROXYZINE HCL 25 MG PO TABS
25.0000 mg | ORAL_TABLET | Freq: Three times a day (TID) | ORAL | Status: DC | PRN
  Administered 2020-02-06 – 2020-02-07 (×3): 25 mg via ORAL
  Filled 2020-02-06 (×3): qty 1

## 2020-02-06 MED ORDER — QUETIAPINE FUMARATE 50 MG PO TABS
50.0000 mg | ORAL_TABLET | Freq: Every day | ORAL | Status: DC
  Administered 2020-02-06 – 2020-02-09 (×4): 50 mg via ORAL
  Filled 2020-02-06 (×6): qty 1

## 2020-02-06 NOTE — Progress Notes (Signed)
D:  Patient's self inventory sheet, patient has poor sleep.  Good appetite, low energy level, good concentration.  Rated depression and hopeless 3, anxiety 1.  Denied withdrawals.  Denied SI.  Denied physical problems.  Denied physical pain.  Goal is stay up and active.  No discharge plans. A:  Medications administered per MD orders.  Emotional support and encouragement given patient. R:  Patient denied SI and HI, contracts for safety.  Denied A/V hallucinations.  Safety maintained with 15 minute checks.

## 2020-02-06 NOTE — Plan of Care (Signed)
Nurse discussed anxiety, depression and coping skills with patient.  

## 2020-02-06 NOTE — Progress Notes (Signed)
Pt stated he was improving      02/06/20 2300  Psych Admission Type (Psych Patients Only)  Admission Status Involuntary  Psychosocial Assessment  Patient Complaints Anxiety;Depression  Eye Contact Fair  Facial Expression Sad;Sullen;Anxious  Affect Anxious;Depressed;Sad;Sullen  Speech Slow  Interaction Minimal;Forwards little;Guarded  Motor Activity Other (Comment) (WNL)  Appearance/Hygiene Unremarkable  Behavior Characteristics Cooperative  Mood Depressed  Thought Process  Coherency WDL  Content WDL  Delusions None reported or observed  Perception WDL  Hallucination None reported or observed  Judgment Poor  Confusion None  Danger to Self  Current suicidal ideation? Denies  Danger to Others  Danger to Others None reported or observed

## 2020-02-06 NOTE — Progress Notes (Signed)
The patient states that what he has learned is that he needs to make more time to talk to people. He admits to isolating himself quite a bit before coming in to the hospital. His goal for tomorrow is to stay positive and stay out of his bedroom.

## 2020-02-06 NOTE — Progress Notes (Addendum)
[facility] MD Progress Note  02/06/2020 9:18 AM Samuel Tate  MRN:  [id number] Subjective:  Patient reports that his appetite is slowly improving he hungry all meal times for the first time in a while. Patient reports that his sleep continues to be poor and that he is having problems both going to sleep and staying asleep, despite receiving trazodone and atarax at bedtime. Otherwise patient reports that his mood is improving and yesterday was his best day yet. Patient reports that he enjoys being in the dayroom and his favorite group therapy topic so far has been meditation. Patient continues to think about his future and at this time thinks he will start looking for a new job as his current one has not made him happy in a while and he feels ready to branch out and try something new and different. Patient was also able to state that he now realizes that suicide is not an appropriate coping mechanism and was able to talk about people who would be upset if he died. Patient was able to contract for safety. Principal Problem: MDD (major depressive disorder), recurrent severe, without psychosis (HCC) Diagnosis: Principal Problem:   MDD (major depressive disorder), recurrent severe, without psychosis (HCC) Active Problems:   Adjustment disorder with mixed anxiety and depressed mood  Total Time spent with patient: 15 minutes  Past Psychiatric History: See H&P  Past Medical History:  Past Medical History:  Diagnosis Date  . Anxiety   . Depression   . Dyspnea    with panic attacks. uses inhaler  . Hypertension     Past Surgical History:  Procedure Laterality Date  . BILATERAL ANTERIOR TOTAL HIP ARTHROPLASTY Bilateral 03/01/2019   Procedure: BILATERAL ANTERIOR TOTAL HIP ARTHROPLASTY;  Surgeon: Kathryne Hitch, MD;  Location: [facility];  Service: Orthopedics;  Laterality: Bilateral;  . FRACTURE SURGERY Right 2010   Family History:  Family History  Problem Relation Age of Onset  . Healthy Mother   .  Healthy Father    Family Psychiatric  History: See H&P Social History:  Social History   Substance and Sexual Activity  Alcohol Use Yes  . Alcohol/week: 2.0 - 4.0 standard drinks  . Types: 2 - 4 Cans of beer per week   Comment: daily     Social History   Substance and Sexual Activity  Drug Use Never    Social History   Socioeconomic History  . Marital status: Divorced    Spouse name: Not on file  . Number of children: 0  . Years of education: Not on file  . Highest education level: Not on file  Occupational History  . Occupation: unemployed  Tobacco Use  . Smoking status: Current Some Day Smoker    Years: 4.00    Types: Cigars  . Smokeless tobacco: Current User    Types: Snuff  Vaping Use  . Vaping Use: Never used  Substance and Sexual Activity  . Alcohol use: Yes    Alcohol/week: 2.0 - 4.0 standard drinks    Types: 2 - 4 Cans of beer per week    Comment: daily  . Drug use: Never  . Sexual activity: Not on file  Other Topics Concern  . Not on file  Social History Narrative  . Not on file   Social Determinants of Health   Financial Resource Strain:   . Difficulty of Paying Living Expenses: Not on file  Food Insecurity:   . Worried About Programme researcher, broadcasting/film/video in the Last Year:  Not on file  . Ran Out of Food in the Last Year: Not on file  Transportation Needs:   . Lack of Transportation (Medical): Not on file  . Lack of Transportation (Non-Medical): Not on file  Physical Activity:   . Days of Exercise per Week: Not on file  . Minutes of Exercise per Session: Not on file  Stress:   . Feeling of Stress : Not on file  Social Connections:   . Frequency of Communication with Friends and Family: Not on file  . Frequency of Social Gatherings with Friends and Family: Not on file  . Attends Religious Services: Not on file  . Active Member of Clubs or Organizations: Not on file  . Attends Banker Meetings: Not on file  . Marital Status: Not on file    Additional Social History:                         Sleep: Poor  Appetite:  Fair  Current Medications: Current Facility-Administered Medications  Medication Dose Route Frequency Provider Last Rate Last Admin  . alum & mag hydroxide-simeth (MAALOX/MYLANTA) 200-200-20 MG/5ML suspension 30 mL  30 mL Oral Q4H PRN Nwoko, Uchenna E, PA      . hydrochlorothiazide (HYDRODIURIL) tablet 50 mg  50 mg Oral Daily Vasil Juhasz, Gerlean Ren B, MD   50 mg at 02/06/20 0817  . hydrOXYzine (ATARAX/VISTARIL) tablet 25 mg  25 mg Oral TID PRN Eliseo Gum B, MD      . losartan (COZAAR) tablet 100 mg  100 mg Oral Daily Eliseo Gum B, MD   100 mg at 02/06/20 0817  . multivitamin with minerals tablet 1 tablet  1 tablet Oral Daily Nwoko, Uchenna E, PA   1 tablet at 02/06/20 0817  . nicotine (NICODERM CQ - dosed in mg/24 hours) patch 21 mg  21 mg Transdermal Daily Eliseo Gum B, MD   21 mg at 02/06/20 0818  . pantoprazole (PROTONIX) EC tablet 20 mg  20 mg Oral Daily Eliseo Gum B, MD   20 mg at 02/06/20 0818  . thiamine (B-1) injection 100 mg  100 mg Intramuscular Once Nwoko, Uchenna E, PA      . thiamine tablet 100 mg  100 mg Oral Daily Nwoko, Uchenna E, PA   100 mg at 02/06/20 0818  . traZODone (DESYREL) tablet 50 mg  50 mg Oral QHS PRN Nwoko, Uchenna E, PA   50 mg at 02/05/20 2141  . venlafaxine XR (EFFEXOR-XR) 24 hr capsule 75 mg  75 mg Oral Q breakfast Eliseo Gum B, MD   75 mg at 02/06/20 0818    Lab Results: No results found for this or any previous visit (from the past 48 hour(s)).  Blood Alcohol level:  Lab Results  Component Value Date   ETH 265 (H) 02/02/2020    Metabolic Disorder Labs: Lab Results  Component Value Date   HGBA1C 5.0 02/03/2020   MPG 96.8 02/03/2020   No results found for: PROLACTIN Lab Results  Component Value Date   CHOL 154 02/03/2020   TRIG 124 02/03/2020   HDL 42 02/03/2020   CHOLHDL 3.7 02/03/2020   VLDL 25 02/03/2020   LDLCALC 87 02/03/2020   LDLCALC 72  04/12/2019    Physical Findings: AIMS:  , ,  ,  ,    CIWA:  CIWA-Ar Total: 0 COWS:     Musculoskeletal: Strength & Muscle Tone: within normal limits Gait & Station: normal Patient  leans: N/A  Psychiatric Specialty Exam: Physical Exam Constitutional:      Appearance: Normal appearance.  HENT:     Head: Normocephalic and atraumatic.  Eyes:     Extraocular Movements: Extraocular movements intact.     Conjunctiva/sclera: Conjunctivae normal.  Pulmonary:     Effort: Pulmonary effort is normal.  Neurological:     Mental Status: He is alert.     Review of Systems  Cardiovascular: Negative for chest pain.  Gastrointestinal: Negative for abdominal pain.  Neurological: Negative for headaches.    Blood pressure (!) 139/100, pulse (!) 101, temperature 98.1 F (36.7 C), temperature source Oral, resp. rate 18, height 5\' 6"  (1.676 m), weight 111.1 kg, SpO2 95 %.Body mass index is 39.54 kg/m.  General Appearance: Well Groomed  Eye Contact:  Good  Speech:  Clear and Coherent  Volume:  Normal  Mood:  "good"  Affect:  Congruent  Thought Process:  Coherent  Orientation:  NA  Thought Content:  Logical  Suicidal Thoughts:  No  Homicidal Thoughts:  No  Memory:  NA  Judgement:  Good  Insight:  Fair  Psychomotor Activity:  Normal  Concentration:  Concentration: Good and Attention Span: Good  Recall:  NA  Fund of Knowledge:  Good  Language:  Good  Akathisia:  No  Handed:  Right  AIMS (if indicated):     Assets:  Communication Skills Desire for Improvement Financial Resources/Insurance Housing Resilience  ADL's:  Intact  Cognition:  WNL  Sleep:  Number of Hours: 6.25     Treatment Plan Summary: Daily contact with patient to assess and evaluate symptoms and progress in treatment  Patient continues to improve incrementally. Patient judgement and insight are also improving and patient is now able to report that he is learning from some of the group therapies. Patient  continues to have issues with sleep will discontinue Trazodone and start seroquel. Scheduled - Effexor XR 75mg ,for depression  - Losartan 100mg  daily - HCTZ50mg  daily - Mulitivitamins - Vit B1 - Nicotine patch 21mg   PRN - Nicorette gum -Tylenol 650mg  q6h -Maalox 2ml q4h -Atarax 25mg  TID -Milk of Mag 32mL -Seroquel 50mg  QHS - Zofran 50mg  QHS  , MD 02/06/2020, 9:18 AM

## 2020-02-07 DIAGNOSIS — F332 Major depressive disorder, recurrent severe without psychotic features: Principal | ICD-10-CM

## 2020-02-07 NOTE — Progress Notes (Signed)
   02/07/20 2300  Psych Admission Type (Psych Patients Only)  Admission Status Involuntary  Psychosocial Assessment  Patient Complaints Anxiety;Depression  Eye Contact Fair  Facial Expression Sullen  Affect Anxious;Depressed  Speech Slow  Interaction Other (Comment) (appropriate)  Motor Activity Other (Comment) (WNL)  Appearance/Hygiene Unremarkable  Behavior Characteristics Cooperative  Mood Depressed  Thought Process  Coherency WDL  Content WDL  Delusions WDL;None reported or observed  Perception WDL  Hallucination None reported or observed  Judgment Poor  Confusion None  Danger to Self  Current suicidal ideation? Denies  Danger to Others  Danger to Others None reported or observed

## 2020-02-07 NOTE — Tx Team (Signed)
Interdisciplinary Treatment and Diagnostic Plan Update  02/07/2020 Time of Session: 10:00am  Samuel Tate MRN: [id number]  Principal Diagnosis: MDD (major depressive disorder), recurrent severe, without psychosis (HCC)  Secondary Diagnoses: Principal Problem:   MDD (major depressive disorder), recurrent severe, without psychosis (HCC) Active Problems:   Adjustment disorder with mixed anxiety and depressed mood   Current Medications:  Current Facility-Administered Medications  Medication Dose Route Frequency Provider Last Rate Last Admin   alum & mag hydroxide-simeth (MAALOX/MYLANTA) 200-200-20 MG/5ML suspension 30 mL  30 mL Oral Q4H PRN Nwoko, Uchenna E, PA       hydrochlorothiazide (HYDRODIURIL) tablet 50 mg  50 mg Oral Daily Eliseo Gum B, MD   50 mg at 02/07/20 2297   hydrOXYzine (ATARAX/VISTARIL) tablet 25 mg  25 mg Oral TID PRN Eliseo Gum B, MD   25 mg at 02/06/20 2123   losartan (COZAAR) tablet 100 mg  100 mg Oral Daily Eliseo Gum B, MD   100 mg at 02/07/20 9892   multivitamin with minerals tablet 1 tablet  1 tablet Oral Daily Nwoko, Uchenna E, PA   1 tablet at 02/07/20 0823   nicotine (NICODERM CQ - dosed in mg/24 hours) patch 21 mg  21 mg Transdermal Daily Eliseo Gum B, MD   21 mg at 02/07/20 0823   pantoprazole (PROTONIX) EC tablet 20 mg  20 mg Oral Daily Eliseo Gum B, MD   20 mg at 02/07/20 0823   QUEtiapine (SEROQUEL) tablet 50 mg  50 mg Oral QHS Eliseo Gum B, MD   50 mg at 02/06/20 2123   thiamine (B-1) injection 100 mg  100 mg Intramuscular Once Nwoko, Uchenna E, PA       thiamine tablet 100 mg  100 mg Oral Daily Nwoko, Uchenna E, PA   100 mg at 02/07/20 1194   venlafaxine XR (EFFEXOR-XR) 24 hr capsule 75 mg  75 mg Oral Q breakfast Eliseo Gum B, MD   75 mg at 02/07/20 1740   PTA Medications: Medications Prior to Admission  Medication Sig Dispense Refill Last Dose   albuterol (VENTOLIN HFA) 108 (90 Base) MCG/ACT inhaler Inhale 1-2 puffs into  the lungs every 6 (six) hours as needed for wheezing or shortness of breath.      alendronate (FOSAMAX) 70 MG tablet Take 70 mg by mouth once a week. Take with a full glass of water on an empty stomach.      cetirizine (ZYRTEC ALLERGY) 10 MG tablet Take 1 tablet (10 mg total) by mouth daily. 30 tablet 0    diphenhydramine-acetaminophen (TYLENOL PM) 25-500 MG TABS tablet Take 2 tablets by mouth at bedtime as needed (sleep/pain).      FLUoxetine (PROZAC) 20 MG capsule Take 1 capsule (20 mg total) by mouth daily. 90 capsule 3    hydrochlorothiazide (HYDRODIURIL) 50 MG tablet Take 1 tablet (50 mg total) by mouth daily. 90 tablet 3    losartan (COZAAR) 100 MG tablet Take 1 tablet (100 mg total) by mouth daily. 90 tablet 3    mometasone (NASONEX) 50 MCG/ACT nasal spray Place 2 sprays into the nose daily. 17 g 1    omeprazole (PRILOSEC) 20 MG capsule Take 1 capsule (20 mg total) by mouth daily. 90 capsule 3    QUEtiapine (SEROQUEL) 100 MG tablet Take 1 tablet (100 mg total) by mouth at bedtime. 90 tablet 3     Patient Stressors: Health problems Marital or family conflict Substance abuse  Patient Strengths: Ability for Heritage manager  General fund of knowledge Supportive family/friends  Treatment Modalities: Medication Management, Group therapy, Case management,  1 to 1 session with clinician, Psychoeducation, Recreational therapy.   Physician Treatment Plan for Primary Diagnosis: MDD (major depressive disorder), recurrent severe, without psychosis (HCC) Long Term Goal(s): Improvement in symptoms so as ready for discharge Improvement in symptoms so as ready for discharge   Short Term Goals: Ability to identify changes in lifestyle to reduce recurrence of condition will improve Ability to verbalize feelings will improve Ability to disclose and discuss suicidal ideas Ability to identify and develop effective coping behaviors will improve Ability to identify and develop  effective coping behaviors will improve Ability to identify triggers associated with substance abuse/mental health issues will improve  Medication Management: Evaluate patient's response, side effects, and tolerance of medication regimen.  Therapeutic Interventions: 1 to 1 sessions, Unit Group sessions and Medication administration.  Evaluation of Outcomes: Adequate for Discharge  Physician Treatment Plan for Secondary Diagnosis: Principal Problem:   MDD (major depressive disorder), recurrent severe, without psychosis (HCC) Active Problems:   Adjustment disorder with mixed anxiety and depressed mood  Long Term Goal(s): Improvement in symptoms so as ready for discharge Improvement in symptoms so as ready for discharge   Short Term Goals: Ability to identify changes in lifestyle to reduce recurrence of condition will improve Ability to verbalize feelings will improve Ability to disclose and discuss suicidal ideas Ability to identify and develop effective coping behaviors will improve Ability to identify and develop effective coping behaviors will improve Ability to identify triggers associated with substance abuse/mental health issues will improve     Medication Management: Evaluate patient's response, side effects, and tolerance of medication regimen.  Therapeutic Interventions: 1 to 1 sessions, Unit Group sessions and Medication administration.  Evaluation of Outcomes: Adequate for Discharge   RN Treatment Plan for Primary Diagnosis: MDD (major depressive disorder), recurrent severe, without psychosis (HCC) Long Term Goal(s): Knowledge of disease and therapeutic regimen to maintain health will improve  Short Term Goals: Ability to remain free from injury will improve, Ability to demonstrate self-control, Ability to participate in decision making will improve, Ability to verbalize feelings will improve, Ability to disclose and discuss suicidal ideas and Ability to identify and  develop effective coping behaviors will improve  Medication Management: RN will administer medications as ordered by provider, will assess and evaluate patient's response and provide education to patient for prescribed medication. RN will report any adverse and/or side effects to prescribing provider.  Therapeutic Interventions: 1 on 1 counseling sessions, Psychoeducation, Medication administration, Evaluate responses to treatment, Monitor vital signs and CBGs as ordered, Perform/monitor CIWA, COWS, AIMS and Fall Risk screenings as ordered, Perform wound care treatments as ordered.  Evaluation of Outcomes: Adequate for Discharge   LCSW Treatment Plan for Primary Diagnosis: MDD (major depressive disorder), recurrent severe, without psychosis (HCC) Long Term Goal(s): Safe transition to appropriate next level of care at discharge, Engage patient in therapeutic group addressing interpersonal concerns.  Short Term Goals: Engage patient in aftercare planning with referrals and resources, Increase social support, Increase emotional regulation, Facilitate acceptance of mental health diagnosis and concerns, Facilitate patient progression through stages of change regarding substance use diagnoses and concerns and Identify triggers associated with mental health/substance abuse issues  Therapeutic Interventions: Assess for all discharge needs, 1 to 1 time with Social worker, Explore available resources and support systems, Assess for adequacy in community support network, Educate family and significant other(s) on suicide prevention, Complete Psychosocial Assessment, Interpersonal group therapy.  Evaluation of Outcomes: Adequate for Discharge   Progress in Treatment: Attending groups: No. Participating in groups: No. Taking medication as prescribed: Yes. Toleration medication: Yes. Family/Significant other contact made: No, will contact:  PT declined consents. Patient understands diagnosis: Yes. and  No. Discussing patient identified problems/goals with staff: Yes. Medical problems stabilized or resolved: Yes. Denies suicidal/homicidal ideation: Yes. Issues/concerns per patient self-inventory: No.  New problem(s) identified: No, Describe:  None   New Short Term/Long Term Goal(s): medication stabilization, elimination of SI thoughts, development of comprehensive mental wellness plan.   Patient Goals: Pt did not attend   Discharge Plan or Barriers: Patient recently admitted. CSW will continue to follow and assess for appropriate referrals and possible discharge planning. Pt continues to decline follow-up care.  Reason for Continuation of Hospitalization: Depression Medication stabilization Suicidal ideation Withdrawal symptoms  Estimated Length of Stay: 3 to 5 days   Attendees: Patient: 02/03/2020   Physician: Pricilla Larsson, MD 02/03/2020   Nursing:  02/03/2020   RN Care Manager: 02/03/2020   Social Worker: Jacinta Shoe, LCSW 02/03/2020   Recreational Therapist:  02/03/2020   Other:  02/03/2020   Other:  02/03/2020   Other: 02/03/2020     Scribe for Treatment Team: Jacinta Shoe, LCSW 02/07/2020 8:30 AM

## 2020-02-07 NOTE — Plan of Care (Signed)
Patient has been in the milieu and has remained cooperative. Attending groups and getting along with peers. Denying thoughts of self-harm. Denying hallucinations. Reports that his appetite is improved. Taking medications as prescribed. Patient has not expressed any concerns. Was encouraged to talk to staff as needed. Safety monitored as recommended.

## 2020-02-07 NOTE — Progress Notes (Signed)
Recreation Therapy Notes  Date:  11.19.21 Time: 0930 Location: [address] Dayroom  Group Topic: Stress Management  Goal Area(s) Addresses:  Patient will identify positive stress management techniques. Patient will identify benefits of using stress management post d/c.  Behavioral Response: Engaged  Intervention: Stress Management  Activity: Guided Imagery.  LRT read a script that took patients for a walk along the [place] sands of a relaxing beach.  Patients were to listen and follow along as script was read to engage in activity.    Education:  Stress Management, Discharge Planning.   Education Outcome: Acknowledges Education  Clinical Observations/Feedback: Pt attended and participated in activity.      Caroll Rancher, LRT/CTRS         Caroll Rancher A 02/07/2020 10:31 AM

## 2020-02-07 NOTE — Progress Notes (Signed)
Patient ID: Gertrude Tarbet, male   DOB: 1991/03/12, 29 y.o.   MRN: [id number]   [facility] MD Progress Note  02/07/2020 1:35 PM Drury Ardizzone  MRN:  [id number] Subjective:    Patient seen, chart reviewed and case discussed with treatment team. Patient reports improved mood and no side effects of the medication regimen. He reports feeling good enough top leave the hospital and requests discharge. Patient intimally was reluctant to accept aftercare, but a conversation was had regarding the neccessity of him to continue medications as well as the severity of his attempt which prompted his admission. Patient states that he is not keen on doctors appointments since they interfere with his work schedule. He is showing future oriented thinking. Patient was able to handle the conversation well and is agreeable to outpatient follow-up at this time.    Principal Problem: MDD (major depressive disorder), recurrent severe, without psychosis (HCC) Diagnosis: Principal Problem:   MDD (major depressive disorder), recurrent severe, without psychosis (HCC) Active Problems:   Adjustment disorder with mixed anxiety and depressed mood  Total Time spent with patient: 15 minutes  Past Psychiatric History: See H&P  Past Medical History:  Past Medical History:  Diagnosis Date  . Anxiety   . Depression   . Dyspnea    with panic attacks. uses inhaler  . Hypertension     Past Surgical History:  Procedure Laterality Date  . BILATERAL ANTERIOR TOTAL HIP ARTHROPLASTY Bilateral 03/01/2019   Procedure: BILATERAL ANTERIOR TOTAL HIP ARTHROPLASTY;  Surgeon: Kathryne Hitch, MD;  Location: [facility];  Service: Orthopedics;  Laterality: Bilateral;  . FRACTURE SURGERY Right 2010   Family History:  Family History  Problem Relation Age of Onset  . Healthy Mother   . Healthy Father    Family Psychiatric  History: See H&P Social History:  Social History   Substance and Sexual Activity  Alcohol Use Yes  . Alcohol/week:  2.0 - 4.0 standard drinks  . Types: 2 - 4 Cans of beer per week   Comment: daily     Social History   Substance and Sexual Activity  Drug Use Never    Social History   Socioeconomic History  . Marital status: Divorced    Spouse name: Not on file  . Number of children: 0  . Years of education: Not on file  . Highest education level: Not on file  Occupational History  . Occupation: unemployed  Tobacco Use  . Smoking status: Current Some Day Smoker    Years: 4.00    Types: Cigars  . Smokeless tobacco: Current User    Types: Snuff  Vaping Use  . Vaping Use: Never used  Substance and Sexual Activity  . Alcohol use: Yes    Alcohol/week: 2.0 - 4.0 standard drinks    Types: 2 - 4 Cans of beer per week    Comment: daily  . Drug use: Never  . Sexual activity: Not on file  Other Topics Concern  . Not on file  Social History Narrative  . Not on file   Social Determinants of Health   Financial Resource Strain:   . Difficulty of Paying Living Expenses: Not on file  Food Insecurity:   . Worried About Programme researcher, broadcasting/film/video in the Last Year: Not on file  . Ran Out of Food in the Last Year: Not on file  Transportation Needs:   . Lack of Transportation (Medical): Not on file  . Lack of Transportation (Non-Medical): Not on file  Physical  Activity:   . Days of Exercise per Week: Not on file  . Minutes of Exercise per Session: Not on file  Stress:   . Feeling of Stress : Not on file  Social Connections:   . Frequency of Communication with Friends and Family: Not on file  . Frequency of Social Gatherings with Friends and Family: Not on file  . Attends Religious Services: Not on file  . Active Member of Clubs or Organizations: Not on file  . Attends Banker Meetings: Not on file  . Marital Status: Not on file   Additional Social History:                         Sleep: Poor  Appetite:  Fair  Current Medications: Current Facility-Administered  Medications  Medication Dose Route Frequency Provider Last Rate Last Admin  . alum & mag hydroxide-simeth (MAALOX/MYLANTA) 200-200-20 MG/5ML suspension 30 mL  30 mL Oral Q4H PRN Nwoko, Uchenna E, PA      . hydrochlorothiazide (HYDRODIURIL) tablet 50 mg  50 mg Oral Daily Eliseo Gum B, MD   50 mg at 02/07/20 2585  . hydrOXYzine (ATARAX/VISTARIL) tablet 25 mg  25 mg Oral TID PRN Bobbye Morton, MD   25 mg at 02/06/20 2123  . losartan (COZAAR) tablet 100 mg  100 mg Oral Daily Eliseo Gum B, MD   100 mg at 02/07/20 2778  . multivitamin with minerals tablet 1 tablet  1 tablet Oral Daily Nwoko, Uchenna E, PA   1 tablet at 02/07/20 0823  . nicotine (NICODERM CQ - dosed in mg/24 hours) patch 21 mg  21 mg Transdermal Daily Eliseo Gum B, MD   21 mg at 02/07/20 0823  . pantoprazole (PROTONIX) EC tablet 20 mg  20 mg Oral Daily Eliseo Gum B, MD   20 mg at 02/07/20 0823  . QUEtiapine (SEROQUEL) tablet 50 mg  50 mg Oral QHS Eliseo Gum B, MD   50 mg at 02/06/20 2123  . thiamine (B-1) injection 100 mg  100 mg Intramuscular Once Nwoko, Uchenna E, PA      . thiamine tablet 100 mg  100 mg Oral Daily Nwoko, Uchenna E, PA   100 mg at 02/07/20 2423  . venlafaxine XR (EFFEXOR-XR) 24 hr capsule 75 mg  75 mg Oral Q breakfast Eliseo Gum B, MD   75 mg at 02/07/20 5361    Lab Results: No results found for this or any previous visit (from the past 48 hour(s)).  Blood Alcohol level:  Lab Results  Component Value Date   ETH 265 (H) 02/02/2020    Metabolic Disorder Labs: Lab Results  Component Value Date   HGBA1C 5.0 02/03/2020   MPG 96.8 02/03/2020   No results found for: PROLACTIN Lab Results  Component Value Date   CHOL 154 02/03/2020   TRIG 124 02/03/2020   HDL 42 02/03/2020   CHOLHDL 3.7 02/03/2020   VLDL 25 02/03/2020   LDLCALC 87 02/03/2020   LDLCALC 72 04/12/2019    Physical Findings: AIMS:  , ,  ,  ,    CIWA:  CIWA-Ar Total: 2 COWS:     Musculoskeletal: Strength & Muscle Tone:  within normal limits Gait & Station: normal Patient leans: N/A  Psychiatric Specialty Exam: Physical Exam Constitutional:      Appearance: Normal appearance.  HENT:     Head: Normocephalic and atraumatic.  Eyes:     Extraocular Movements: Extraocular movements intact.  Conjunctiva/sclera: Conjunctivae normal.  Pulmonary:     Effort: Pulmonary effort is normal.  Neurological:     Mental Status: He is alert.     Review of Systems  Cardiovascular: Negative for chest pain.  Gastrointestinal: Negative for abdominal pain.  Neurological: Negative for headaches.    Blood pressure (!) 155/101, pulse (!) 111, temperature 98.5 F (36.9 C), temperature source Oral, resp. rate 18, height 5\' 6"  (1.676 m), weight 111.1 kg, SpO2 99 %.Body mass index is 39.54 kg/m.  General Appearance: Well Groomed  Eye Contact:  Good  Speech:  Clear and Coherent  Volume:  Normal  Mood:  "good"  Affect:  Congruent  Thought Process:  Coherent  Orientation:  NA  Thought Content:  Logical  Suicidal Thoughts:  No  Homicidal Thoughts:  No  Memory:  NA  Judgement:  Good  Insight:  Fair  Psychomotor Activity:  Normal  Concentration:  Concentration: Good and Attention Span: Good  Recall:  NA  Fund of Knowledge:  Good  Language:  Good  Akathisia:  No  Handed:  Right  AIMS (if indicated):     Assets:  Communication Skills Desire for Improvement Financial Resources/Insurance Housing Resilience  ADL's:  Intact  Cognition:  WNL  Sleep:  Number of Hours: 6.5     Treatment Plan Summary: Daily contact with patient to assess and evaluate symptoms and progress in treatment  Patient with recent severe suicide attempt continues to improve incrementally. Awaiting safe discharge planning at this time.   Scheduled - Effexor XR 75mg ,for depression  - Losartan 100mg  daily - HCTZ50mg  daily - Mulitivitamins - Vit B1 - Nicotine patch 21mg   PRN - Nicorette gum -Tylenol 650mg  q6h -Maalox 58ml  q4h -Atarax 25mg  TID -Milk of Mag 42mL -Seroquel 50mg  QHS - Zofran 50mg  QHS  , MD 02/07/2020, 1:35 PM

## 2020-02-07 NOTE — [facility] Counselor (Signed)
LCSW Group Therapy Note  02/07/2020   1430  Type of Therapy and Topic:  Group Therapy: What's So Funny About Mental Illness?  Participation Level:  Active   Description of Group:   In this group, patients learned how to recognize how they personally define their mental illness. This group also addressed stigma associated with mental illness.  Patients were asked to give examples of experiences surrounding living with a mental illness and how it makes them feel. Patients were asked to self-reflect on how they have chosen to address their mental health thus far and were invited to share those lessons or to discuss how stigma has affected their mental health care. Patients actively explore how their mental illness has impacted decisions and actions as well as how future decisions can be impacted.  Therapeutic Goals: 1. Patients will identify what can be considered mental illness 2. Patients will identify lessons learned from past experiences and how they can be applied to future struggles. 3. Patients will establish rapport with peers in a therapeutic setting.  Summary of Patient Progress: Samuel Tate was an active participant in today's group. The patient did not share his understanding and/or definition of mental illness. The patient actively listened while others shared their experience with mental illness and how it has impacted their mental health.   Therapeutic Modalities:   Cognitive Behavioral Therapy    Jacinta Shoe, LCSW 02/07/2020  2:50 PM

## 2020-02-07 NOTE — Progress Notes (Signed)
Patient did attend the evening speaker AA meeting.  

## 2020-02-08 MED ORDER — METOPROLOL SUCCINATE ER 25 MG PO TB24
25.0000 mg | ORAL_TABLET | Freq: Every day | ORAL | Status: DC
  Administered 2020-02-08 – 2020-02-09 (×2): 25 mg via ORAL
  Filled 2020-02-08 (×5): qty 1

## 2020-02-08 MED ORDER — HYDROXYZINE HCL 10 MG PO TABS: 10.0000 mg | ORAL_TABLET | Freq: Three times a day (TID) | ORAL | Status: DC | PRN

## 2020-02-08 NOTE — Progress Notes (Signed)
   02/08/20 2256  Psych Admission Type (Psych Patients Only)  Admission Status Involuntary  Psychosocial Assessment  Patient Complaints None  Eye Contact Fair  Facial Expression Flat  Affect Appropriate to circumstance  Speech Logical/coherent  Interaction Assertive  Motor Activity Other (Comment) (WDL)  Appearance/Hygiene Unremarkable  Behavior Characteristics Appropriate to situation  Mood Depressed  Thought Process  Coherency WDL  Content WDL  Delusions None reported or observed  Perception WDL  Hallucination None reported or observed  Judgment Poor  Confusion None  Danger to Self  Current suicidal ideation? Denies  Danger to Others  Danger to Others None reported or observed

## 2020-02-08 NOTE — Progress Notes (Addendum)
   02/08/20 1300  Psych Admission Type (Psych Patients Only)  Admission Status Involuntary  Psychosocial Assessment  Patient Complaints Anxiety  Eye Contact Fair  Facial Expression Sad  Affect Appropriate to circumstance  Speech Slow  Interaction Other (Comment) (appropriate)  Motor Activity Other (Comment) (WNL)  Appearance/Hygiene Unremarkable  Behavior Characteristics Cooperative  Mood Depressed;Anxious  Thought Process  Coherency WDL  Content WDL  Delusions WDL;None reported or observed  Perception WDL  Hallucination None reported or observed  Judgment Poor  Confusion None  Danger to Self  Current suicidal ideation? Denies  Danger to Others  Danger to Others None reported or observed   D. Pt has been calm and cooperative, observed on the unit attending group, and interacting well with peers and staff. Per pt's self inventory, pt rated his depression, hopelessness and anxiety a 2/2/1, respectively. Pt writes that his goal today is to work on a "positive mindset" , and will "stay active in dayroom and group". Pt currently denies SI/HI and AVH  A. Labs and vitals monitored. Pt compliant with medications. Pt supported emotionally and encouraged to express concerns and ask questions.   R. Pt remains safe with 15 minute checks. Will continue POC.

## 2020-02-08 NOTE — Progress Notes (Signed)
   02/08/20 2252  COVID-19 Daily Checkoff  Have you had a fever (temp > 37.80C/100F)  in the past 24 hours?  No  If you have had runny nose, nasal congestion, sneezing in the past 24 hours, has it worsened? No  COVID-19 EXPOSURE  Have you traveled outside the state in the past 14 days? No  Have you been in contact with someone with a confirmed diagnosis of COVID-19 or PUI in the past 14 days without wearing appropriate PPE? No  Have you been living in the same home as a person with confirmed diagnosis of COVID-19 or a PUI (household contact)? No  Have you been diagnosed with COVID-19? No

## 2020-02-08 NOTE — Progress Notes (Signed)
[facility] Group Notes:  (Nursing/MHT/Case Management/Adjunct)  Date:  02/08/2020  Time:  2030 Type of Therapy:  wrap up group  Participation Level:  Active  Participation Quality:  Appropriate, Attentive, Sharing and Supportive  Affect:  Appropriate  Cognitive:  Appropriate  Insight:  Improving  Engagement in Group:  Engaged  Modes of Intervention:  Clarification, Education and Support  Summary of Progress/Problems: Positive thinking and positive change were discussed.   Samuel Tate 02/08/2020, 9:55 PM

## 2020-02-08 NOTE — [facility] Counselor (Signed)
Clinical Social Work Note  CSW met with patient to discuss his discharge plans and finalize them.  He stated nobody has talked with him about discharging or about his follow-up needs.  He is worried about being able to leave his job at different times to go to follow-up doctor and therapy appointments.  He stated that he has not been following up with a doctor for his hypertension medicine, as the person he saw a year ago simply wrote his medication for a year.  He stated he will be living with his mother and his mother/stepfather will be picking him up at d/c.  CSW and patient met together with the doctor who assured him that we can arrange a discharge plan that he is more comfortable with.  In the meantime, he is going to contact his mother who can find out about taking time off from work for necessary appointments.  Selmer Dominion, LCSW 02/08/2020, 9:21 AM

## 2020-02-08 NOTE — Progress Notes (Signed)
Patient ID: Samuel Tate, male   DOB: 11/06/1990, 29 y.o.   MRN: [id number]   [facility] MD Progress Note  02/08/2020 1:09 PM Samuel Tate  MRN:  [id number] Subjective:  "I couldn't go today because I don't have anything in place."  Patient seen, chart reviewed and case discussed with providers.  4 year-old heavy set caucasian male with a 10 year + history of major depression. The pt attempted to overdose on blood pressure medications while intoxicated with alcohol (BAL 265) following an abrupt break-up with his girlfriend. The patient texted "goodbye" to his cousin and was subsequently IVC'd via EMS, after a prompt by his mother. He has a history of chronic pain and HTN. He endorses previously unsuccessful SSRI medication trials as well as alcohol abuse. This resulted in sporadic treatment attempts for depression and alcohol abuse, the context of which remains unclear. The patient presents today in appropriate dress. He is calm, cooperative and with good eye-contact, alert and oriented to person, place, situation and time. He appeared pleasant and comfortable, but endorsed anxiety as it relates to the discharge process. The patient appears slightly evasive when prompted about his suicide attempt. The patient's speech was thoughtful and articulate albeit a little slowed. His mood was "good" but his affect restricted. The patient's thought process is tight, logical and goal-directed. He has no delusions, thoughts of harm or a concrete plan of any kind to end his life right now. The patient currently exercises judgment with respect to his treatment plan, but further questioning is required to evaluate his incite surrounding the events that led to his suicide attempt.   Principal Problem: MDD (major depressive disorder), recurrent severe, without psychosis (HCC) Diagnosis: Principal Problem:   MDD (major depressive disorder), recurrent severe, without psychosis (HCC)  Total Time spent with patient: 25 minutes  Past  Psychiatric History: depression and anxiety  Past Medical History:  Past Medical History:  Diagnosis Date  . Anxiety   . Depression   . Dyspnea    with panic attacks. uses inhaler  . Hypertension     Past Surgical History:  Procedure Laterality Date  . BILATERAL ANTERIOR TOTAL HIP ARTHROPLASTY Bilateral 03/01/2019   Procedure: BILATERAL ANTERIOR TOTAL HIP ARTHROPLASTY;  Surgeon: Kathryne Hitch, MD;  Location: [facility];  Service: Orthopedics;  Laterality: Bilateral;  . FRACTURE SURGERY Right 2010   Family History:  Family History  Problem Relation Age of Onset  . Healthy Mother   . Healthy Father    Family Psychiatric  History: See H&P Social History:  Social History   Substance and Sexual Activity  Alcohol Use Yes  . Alcohol/week: 2.0 - 4.0 standard drinks  . Types: 2 - 4 Cans of beer per week   Comment: daily     Social History   Substance and Sexual Activity  Drug Use Never    Social History   Socioeconomic History  . Marital status: Divorced    Spouse name: Not on file  . Number of children: 0  . Years of education: Not on file  . Highest education level: Not on file  Occupational History  . Occupation: unemployed  Tobacco Use  . Smoking status: Current Some Day Smoker    Years: 4.00    Types: Cigars  . Smokeless tobacco: Current User    Types: Snuff  Vaping Use  . Vaping Use: Never used  Substance and Sexual Activity  . Alcohol use: Yes    Alcohol/week: 2.0 - 4.0 standard drinks  Types: 2 - 4 Cans of beer per week    Comment: daily  . Drug use: Never  . Sexual activity: Not on file  Other Topics Concern  . Not on file  Social History Narrative  . Not on file   Social Determinants of Health   Financial Resource Strain:   . Difficulty of Paying Living Expenses: Not on file  Food Insecurity:   . Worried About Programme researcher, broadcasting/film/video in the Last Year: Not on file  . Ran Out of Food in the Last Year: Not on file  Transportation Needs:    . Lack of Transportation (Medical): Not on file  . Lack of Transportation (Non-Medical): Not on file  Physical Activity:   . Days of Exercise per Week: Not on file  . Minutes of Exercise per Session: Not on file  Stress:   . Feeling of Stress : Not on file  Social Connections:   . Frequency of Communication with Friends and Family: Not on file  . Frequency of Social Gatherings with Friends and Family: Not on file  . Attends Religious Services: Not on file  . Active Member of Clubs or Organizations: Not on file  . Attends Banker Meetings: Not on file  . Marital Status: Not on file   Additional Social History:                         Sleep: Poor  Appetite:  Fair  Current Medications: Current Facility-Administered Medications  Medication Dose Route Frequency Provider Last Rate Last Admin  . alum & mag hydroxide-simeth (MAALOX/MYLANTA) 200-200-20 MG/5ML suspension 30 mL  30 mL Oral Q4H PRN Nwoko, Uchenna E, PA      . hydrochlorothiazide (HYDRODIURIL) tablet 50 mg  50 mg Oral Daily Eliseo Gum B, MD   50 mg at 02/08/20 4098  . hydrOXYzine (ATARAX/VISTARIL) tablet 10 mg  10 mg Oral TID PRN Charm Rings, NP      . losartan (COZAAR) tablet 100 mg  100 mg Oral Daily Eliseo Gum B, MD   100 mg at 02/08/20 0809  . multivitamin with minerals tablet 1 tablet  1 tablet Oral Daily Nwoko, Uchenna E, PA   1 tablet at 02/08/20 0808  . nicotine (NICODERM CQ - dosed in mg/24 hours) patch 21 mg  21 mg Transdermal Daily Eliseo Gum B, MD   21 mg at 02/08/20 0800  . pantoprazole (PROTONIX) EC tablet 20 mg  20 mg Oral Daily Eliseo Gum B, MD   20 mg at 02/08/20 0809  . QUEtiapine (SEROQUEL) tablet 50 mg  50 mg Oral QHS Eliseo Gum B, MD   50 mg at 02/07/20 2142  . thiamine (B-1) injection 100 mg  100 mg Intramuscular Once Nwoko, Uchenna E, PA      . thiamine tablet 100 mg  100 mg Oral Daily Nwoko, Uchenna E, PA   100 mg at 02/08/20 0809  . venlafaxine XR (EFFEXOR-XR)  24 hr capsule 75 mg  75 mg Oral Q breakfast Eliseo Gum B, MD   75 mg at 02/08/20 1191    Lab Results: No results found for this or any previous visit (from the past 48 hour(s)).  Blood Alcohol level:  Lab Results  Component Value Date   ETH 265 (H) 02/02/2020    Metabolic Disorder Labs: Lab Results  Component Value Date   HGBA1C 5.0 02/03/2020   MPG 96.8 02/03/2020   No results found for:  PROLACTIN Lab Results  Component Value Date   CHOL 154 02/03/2020   TRIG 124 02/03/2020   HDL 42 02/03/2020   CHOLHDL 3.7 02/03/2020   VLDL 25 02/03/2020   LDLCALC 87 02/03/2020   LDLCALC 72 04/12/2019    Physical Findings: AIMS:  , ,  ,  ,    CIWA:  CIWA-Ar Total: 2 COWS:     Musculoskeletal: Strength & Muscle Tone: within normal limits Gait & Station: normal Patient leans: N/A  Psychiatric Specialty Exam: Physical Exam Vitals and nursing note reviewed.  Constitutional:      Appearance: Normal appearance.  HENT:     Head: Normocephalic and atraumatic.  Eyes:     Extraocular Movements: Extraocular movements intact.     Conjunctiva/sclera: Conjunctivae normal.  Pulmonary:     Effort: Pulmonary effort is normal.  Musculoskeletal:        General: Normal range of motion.     Cervical back: Normal range of motion.  Neurological:     General: No focal deficit present.     Mental Status: He is alert and oriented to person, place, and time.  Psychiatric:        Attention and Perception: Attention and perception normal.        Mood and Affect: Mood is anxious and depressed.        Speech: Speech normal.        Behavior: Behavior normal. Behavior is cooperative.        Thought Content: Thought content normal.        Cognition and Memory: Cognition and memory normal.        Judgment: Judgment normal.     Review of Systems  Cardiovascular: Negative for chest pain.  Gastrointestinal: Negative for abdominal pain.  Neurological: Negative for headaches.   Psychiatric/Behavioral: Positive for dysphoric mood. The patient is nervous/anxious.   All other systems reviewed and are negative.   Blood pressure (!) 165/102, pulse (!) 107, temperature 98.2 F (36.8 C), temperature source Oral, resp. rate 16, height 5\' 6"  (1.676 m), weight 111.1 kg, SpO2 100 %.Body mass index is 39.54 kg/m.  General Appearance: Well Groomed  Eye Contact:  Good  Speech:  Clear and Coherent  Volume:  Normal  Mood:  Depressed and anxious  Affect:  Congruent  Thought Process:  Coherent  Orientation:  NA  Thought Content:  Logical  Suicidal Thoughts:  No  Homicidal Thoughts:  No  Memory:  NA  Judgement:  Good  Insight:  Fair  Psychomotor Activity:  Normal  Concentration:  Concentration: Good and Attention Span: Good  Recall:  NA  Fund of Knowledge:  Good  Language:  Good  Akathisia:  No  Handed:  Right  AIMS (if indicated):     Assets:  Communication Skills Desire for Improvement Financial Resources/Insurance Housing Resilience  ADL's:  Intact  Cognition:  WNL  Sleep:  Number of Hours: 6.5     Treatment Plan Summary: Daily contact with patient to assess and evaluate symptoms and progress in treatment  Patient with recent severe suicide attempt continues to improve incrementally. Awaiting safe discharge planning at this time.  Major depressive disorder, recurrent, severe without psychosis: - Effexor XR 75 mg for depression   Anxiety: -Decreased hydroxyzine 25 mg TID PRN to 10 mg TID  HTN: - Losartan 100mg  daily - HCTZ50mg  daily  Insomnia: -Seroquel 50mg  QHS  , NP 02/08/2020, 1:09 PM

## 2020-02-08 NOTE — Progress Notes (Signed)
[place] NOVEL CORONAVIRUS (COVID-19) DAILY CHECK-OFF SYMPTOMS - answer yes or no to each - every day NO YES  Have you had a fever in the past 24 hours?  . Fever (Temp > 37.80C / 100F) X   Have you had any of these symptoms in the past 24 hours? . New Cough .  Sore Throat  .  Shortness of Breath .  Difficulty Breathing .  Unexplained Body Aches   X   Have you had any one of these symptoms in the past 24 hours not related to allergies?   . Runny Nose .  Nasal Congestion .  Sneezing   X   If you have had runny nose, nasal congestion, sneezing in the past 24 hours, has it worsened?  X   EXPOSURES - check yes or no X   Have you traveled outside the state in the past 14 days?  X   Have you been in contact with someone with a confirmed diagnosis of COVID-19 or PUI in the past 14 days without wearing appropriate PPE?  X   Have you been living in the same home as a person with confirmed diagnosis of COVID-19 or a PUI (household contact)?    X   Have you been diagnosed with COVID-19?    X              What to do next: Answered NO to all: Answered YES to anything:   Proceed with unit schedule Follow the [facility] Inpatient Flowsheet.   

## 2020-02-09 DIAGNOSIS — T50902A Poisoning by unspecified drugs, medicaments and biological substances, intentional self-harm, initial encounter: Secondary | ICD-10-CM | POA: Diagnosis not present

## 2020-02-09 LAB — COMPREHENSIVE METABOLIC PANEL
ALT: 82 U/L — ABNORMAL HIGH (ref 0–44)
AST: 49 U/L — ABNORMAL HIGH (ref 15–41)
Albumin: 4.4 g/dL (ref 3.5–5.0)
Alkaline Phosphatase: 100 U/L (ref 38–126)
Anion gap: 14 (ref 5–15)
BUN: 11 mg/dL (ref 6–20)
CO2: 27 mmol/L (ref 22–32)
Calcium: 9.1 mg/dL (ref 8.9–10.3)
Chloride: 96 mmol/L — ABNORMAL LOW (ref 98–111)
Creatinine, Ser: 0.86 mg/dL (ref 0.61–1.24)
GFR, Estimated: >60 mL/min (ref >60)
Glucose, Bld: 118 mg/dL — ABNORMAL HIGH (ref 70–99)
Potassium: 3.5 mmol/L (ref 3.5–5.1)
Sodium: 137 mmol/L (ref 135–145)
Total Bilirubin: 1.6 mg/dL — ABNORMAL HIGH (ref 0.3–1.2)
Total Protein: 8.1 g/dL (ref 6.5–8.1)

## 2020-02-09 MED ORDER — METOPROLOL SUCCINATE ER 50 MG PO TB24
50.0000 mg | ORAL_TABLET | Freq: Every day | ORAL | Status: DC
  Administered 2020-02-10: 50 mg via ORAL
  Filled 2020-02-09 (×3): qty 1

## 2020-02-09 MED ORDER — METOPROLOL SUCCINATE ER 25 MG PO TB24
25.0000 mg | ORAL_TABLET | ORAL | Status: AC
  Administered 2020-02-09: 25 mg via ORAL
  Filled 2020-02-09 (×2): qty 1

## 2020-02-09 NOTE — [facility] Group Notes (Signed)
Psychoeducational Group Note  Date: 02-09-20 Time:  1300  Group Topic/Focus:  Making Healthy Choices:   The focus of this group is to help patients identify negative/unhealthy choices they were using prior to admission and identify positive/healthier coping strategies to replace them upon discharge.  Participation Level:  Active  Participation Quality:  Appropriate  Affect:  Appropriate  Cognitive:  Oriented  Insight:  Improving  Engagement in Group:  Engaged  Additional Comments:  Pt partisipated in the group rating his energy at a 7/10. Was able to read his list of positives about himself outloud to the group    Vira Blanco A

## 2020-02-09 NOTE — Progress Notes (Addendum)
   02/09/20 1400  Psych Admission Type (Psych Patients Only)  Admission Status Involuntary  Psychosocial Assessment  Patient Complaints Anxiety  Eye Contact Fair  Facial Expression Flat  Affect Appropriate to circumstance  Speech Logical/coherent  Interaction Assertive  Motor Activity Other (Comment) (WDL)  Appearance/Hygiene Unremarkable  Behavior Characteristics Cooperative;Appropriate to situation  Mood Depressed  Thought Process  Coherency WDL  Content WDL  Delusions None reported or observed  Perception WDL  Hallucination None reported or observed  Judgment Poor  Confusion None  Danger to Self  Current suicidal ideation? Denies  Danger to Others  Danger to Others None reported or observed   Pt reports an improving mood today- per pt's self inventory, pt rated his depression, hopelessness and anxiety a 2/2/3, respectively. Pt remains quiet, but has been visible in the dayroom throughout the shift attending groups. Pt denies SI/HI and A/VH.

## 2020-02-09 NOTE — Progress Notes (Signed)
[facility] Group Notes:  (Nursing/MHT/Case Management/Adjunct)  Date:  02/09/2020  Time:  2030  Type of Therapy:  wrap up group  Participation Level:  Active  Participation Quality:  Appropriate, Attentive, Sharing and Supportive  Affect:  Appropriate  Cognitive:  Appropriate  Insight:  Improving  Engagement in Group:  Engaged  Modes of Intervention:  Clarification, Education and Support  Summary of Progress/Problems: Positive thinking and self-care were discussed.   Samuel Tate 02/09/2020, 11:24 PM

## 2020-02-09 NOTE — Progress Notes (Signed)
[facility] MD Progress Note  02/09/2020 9:25 AM Samuel Tate  MRN:  [id number] Subjective:  Patient reports that he is "pretty good" today. Spoke with patient about plans at discharge. He continues to think about switching jobs. Addressed with patient the need for follow-up as patient does not have a PCP or Psychiatrist and will need his medications continued and needs someone to follow-up his hypertension. Patient understood and reports that he will see a psychiatrist so that he can get his medications. Otherwise patient appetite is good and he slept well. Patient contracts for safety.  Principal Problem: MDD (major depressive disorder), recurrent severe, without psychosis (HCC) Diagnosis: Principal Problem:   MDD (major depressive disorder), recurrent severe, without psychosis (HCC)  Total Time spent with patient: 15 minutes  Past Psychiatric History: See H&P  Past Medical History:  Past Medical History:  Diagnosis Date   Anxiety    Depression    Dyspnea    with panic attacks. 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   Procedure: BILATERAL ANTERIOR TOTAL HIP ARTHROPLASTY;  Surgeon: Kathryne Hitch, MD;  Location: [facility];  Service: Orthopedics;  Laterality: Bilateral;   FRACTURE SURGERY Right 2010   Family History:  Family History  Problem Relation Age of Onset   Healthy Mother    Healthy Father    Family Psychiatric  History: See H&P Social History:  Social History   Substance and Sexual Activity  Alcohol Use Yes   Alcohol/week: 2.0 - 4.0 standard drinks   Types: 2 - 4 Cans of beer per week   Comment: daily     Social History   Substance and Sexual Activity  Drug Use Never    Social History   Socioeconomic History   Marital status: Divorced    Spouse name: Not on file   Number of children: 0   Years of education: Not on file   Highest  education level: Not on file  Occupational History   Occupation: unemployed  Tobacco Use   Smoking status: Current Some Day Smoker    Years: 4.00    Types: Cigars   Smokeless tobacco: Current User    Types: Snuff  Vaping Use   Vaping Use: Never used  Substance and Sexual Activity   Alcohol use: Yes    Alcohol/week: 2.0 - 4.0 standard drinks    Types: 2 - 4 Cans of beer per week    Comment: daily   Drug use: Never   Sexual activity: Not on file  Other Topics Concern   Not on file  Social History Narrative   Not on file   Social Determinants of Health   Financial Resource Strain:    Difficulty of Paying Living Expenses: Not on file  Food Insecurity:    Worried About Running Out of Food in the Last Year: Not on file   Ran Out of Food in the Last Year: Not on file  Transportation Needs:    Lack of Transportation (Medical): Not on file   Lack of Transportation (Non-Medical): Not on file  Physical Activity:    Days of Exercise per Week: Not on file   Minutes of Exercise per Session: Not on file  Stress:    Feeling of Stress : Not on file  Social Connections:    Frequency of Communication with Friends and Family: Not on file   Frequency of Social Gatherings with Friends  and Family: Not on file   Attends Religious Services: Not on file   Active Member of Clubs or Organizations: Not on file   Attends Banker Meetings: Not on file   Marital Status: Not on file   Additional Social History:                         Sleep: Good  Appetite:  Good  Current Medications: Current Facility-Administered Medications  Medication Dose Route Frequency Provider Last Rate Last Admin   alum & mag hydroxide-simeth (MAALOX/MYLANTA) 200-200-20 MG/5ML suspension 30 mL  30 mL Oral Q4H PRN Nwoko, Uchenna E, PA       hydrochlorothiazide (HYDRODIURIL) tablet 50 mg  50 mg Oral Daily Eliseo Gum B, MD   50 mg at 02/09/20 0904   hydrOXYzine  (ATARAX/VISTARIL) tablet 10 mg  10 mg Oral TID PRN Charm Rings, NP       losartan (COZAAR) tablet 100 mg  100 mg Oral Daily Eliseo Gum B, MD   100 mg at 02/09/20 0905   metoprolol succinate (TOPROL-XL) 24 hr tablet 25 mg  25 mg Oral Daily Antonieta Pert, MD   25 mg at 02/09/20 0905   multivitamin with minerals tablet 1 tablet  1 tablet Oral Daily Nwoko, Uchenna E, PA   1 tablet at 02/09/20 0905   nicotine (NICODERM CQ - dosed in mg/24 hours) patch 21 mg  21 mg Transdermal Daily Eliseo Gum B, MD   21 mg at 02/09/20 0908   pantoprazole (PROTONIX) EC tablet 20 mg  20 mg Oral Daily Eliseo Gum B, MD   20 mg at 02/09/20 0904   QUEtiapine (SEROQUEL) tablet 50 mg  50 mg Oral QHS Eliseo Gum B, MD   50 mg at 02/08/20 2117   thiamine (B-1) injection 100 mg  100 mg Intramuscular Once Nwoko, Uchenna E, PA       thiamine tablet 100 mg  100 mg Oral Daily Nwoko, Uchenna E, PA   100 mg at 02/09/20 0905   venlafaxine XR (EFFEXOR-XR) 24 hr capsule 75 mg  75 mg Oral Q breakfast Eliseo Gum B, MD   75 mg at 02/09/20 0932    Lab Results:  Results for orders placed or performed during the hospital encounter of 02/02/20 (from the past 48 hour(s))  Comprehensive metabolic panel     Status: Abnormal   Collection Time: 02/09/20  7:00 AM  Result Value Ref Range   Sodium 137 135 - 145 mmol/L   Potassium 3.5 3.5 - 5.1 mmol/L   Chloride 96 (L) 98 - 111 mmol/L   CO2 27 22 - 32 mmol/L   Glucose, Bld 118 (H) 70 - 99 mg/dL    Comment: Glucose reference range applies only to samples taken after fasting for at least 8 hours.   BUN 11 6 - 20 mg/dL   Creatinine, Ser 3.55 0.61 - 1.24 mg/dL   Calcium 9.1 8.9 - 73.2 mg/dL   Total Protein 8.1 6.5 - 8.1 g/dL   Albumin 4.4 3.5 - 5.0 g/dL   AST 49 (H) 15 - 41 U/L   ALT 82 (H) 0 - 44 U/L   Alkaline Phosphatase 100 38 - 126 U/L   Total Bilirubin 1.6 (H) 0.3 - 1.2 mg/dL   GFR, Estimated >20 >25 mL/min    Comment: (NOTE) Calculated using the CKD-EPI  Creatinine Equation (2021)    Anion gap 14 5 - 15  Comment: Performed at [facility], [address], [place], [place] [address]    Blood Alcohol level:  Lab Results  Component Value Date   ETH 265 (H) 02/02/2020    Metabolic Disorder Labs: Lab Results  Component Value Date   HGBA1C 5.0 02/03/2020   MPG 96.8 02/03/2020   No results found for: PROLACTIN Lab Results  Component Value Date   CHOL 154 02/03/2020   TRIG 124 02/03/2020   HDL 42 02/03/2020   CHOLHDL 3.7 02/03/2020   VLDL 25 02/03/2020   LDLCALC 87 02/03/2020   LDLCALC 72 04/12/2019    Physical Findings: AIMS: Facial and Oral Movements Muscles of Facial Expression: None, normal Lips and Perioral Area: None, normal Jaw: None, normal Tongue: None, normal,Extremity Movements Upper (arms, wrists, hands, fingers): None, normal Lower (legs, knees, ankles, toes): None, normal, Trunk Movements Neck, shoulders, hips: None, normal, Overall Severity Severity of abnormal movements (highest score from questions above): None, normal Incapacitation due to abnormal movements: None, normal Patient's awareness of abnormal movements (rate only patient's report): No Awareness, Dental Status Current problems with teeth and/or dentures?: No Does patient usually wear dentures?: No  CIWA:  CIWA-Ar Total: 2 COWS:     Musculoskeletal: Strength & Muscle Tone: within normal limits Gait & Station: normal Patient leans: N/A  Psychiatric Specialty Exam: Physical Exam Constitutional:      Appearance: Normal appearance.  HENT:     Head: Normocephalic and atraumatic.  Pulmonary:     Effort: Pulmonary effort is normal.  Neurological:     Mental Status: He is alert.     Review of Systems  Cardiovascular: Negative for chest pain.  Gastrointestinal: Negative for abdominal pain.  Neurological: Negative for headaches.    Blood pressure (!) 161/110, pulse 97, temperature 97.6 F (36.4 C), temperature source  Oral, resp. rate 16, height 5\' 6"  (1.676 m), weight 111.1 kg, SpO2 100 %.Body mass index is 39.54 kg/m.  General Appearance: Well Groomed  Eye Contact:  Good  Speech:  Clear and Coherent  Volume:  Normal  Mood:  Euthymic  Affect:  Appropriate  Thought Process:  Coherent  Orientation:  NA  Thought Content:  Logical  Suicidal Thoughts:  No  Homicidal Thoughts:  No  Memory:  NA  Judgement:  Good  Insight:  Good  Psychomotor Activity:  Normal  Concentration:  Concentration: Good and Attention Span: Good  Recall:  NA  Fund of Knowledge:  Good  Language:  Good  Akathisia:  No  Handed:  Right  AIMS (if indicated):     Assets:  Communication Skills Desire for Improvement Financial Resources/Insurance Housing Social Support  ADL's:  Intact  Cognition:  WNL  Sleep:  Number of Hours: 6.75     Treatment Plan Summary: Daily contact with patient to assess and evaluate symptoms and progress in treatment  Patient continues to improve and remains forward thinking with good insight and judgement. Patient is willing to do what is necessary for his health including having follow-up. Emphasized today the importance of a PCP especially if patient leaves the area and that he needs to be seeing someone for his hypertension. Patient understood. SW working on discharge.   Scheduled - Effexor XR75mg ,for depression  - Losartan 100mg  daily - HCTZ50mg  daily - Mulitivitamins - Vit B1 - Nicotine patch 21mg   PRN - Nicorette gum -Tylenol 650mg  q6h -Maalox 35ml q4h -Atarax 10mg  TID -Milk of Mag 29mL -Seroquel 50mg  QHS - Zofran 50mg  QHS   ,  MD 02/09/2020, 9:25 AM

## 2020-02-09 NOTE — [facility] Group Notes (Signed)
Adult Psychoeducational Group Not Date:  02/09/2020 Time:  0900-1045 Group Topic/Focus: PROGRESSIVE RELAXATION. A group where deep breathing is taught and tensing and relaxation muscle groups is used. Imagery is used as well.  Pts are asked to imagine 3 pillars that hold them up when they are not able to hold themselves up.  Participation Level:  Active  Participation Quality:  Appropriate  Affect:  Appropriate  Cognitive:  Oriented  Insight: Improving  Engagement in Group:  Engaged  Modes of Intervention:  Activity, Discussion, Education, and Support  Additional Comments:  Pt sits in the group quietly. Obvious that he is paying attention and does respond when asked questions. Pt reported that his 3 pillars are: his family, his miusic and his dog. Energy level is a 7/10  Dione Housekeeper 02/09/2020`

## 2020-02-09 NOTE — Progress Notes (Signed)
[place] NOVEL CORONAVIRUS (COVID-19) DAILY CHECK-OFF SYMPTOMS - answer yes or no to each - every day NO YES  Have you had a fever in the past 24 hours?  . Fever (Temp > 37.80C / 100F) X   Have you had any of these symptoms in the past 24 hours? . New Cough .  Sore Throat  .  Shortness of Breath .  Difficulty Breathing .  Unexplained Body Aches   X   Have you had any one of these symptoms in the past 24 hours not related to allergies?   . Runny Nose .  Nasal Congestion .  Sneezing   X   If you have had runny nose, nasal congestion, sneezing in the past 24 hours, has it worsened?  X   EXPOSURES - check yes or no X   Have you traveled outside the state in the past 14 days?  X   Have you been in contact with someone with a confirmed diagnosis of COVID-19 or PUI in the past 14 days without wearing appropriate PPE?  X   Have you been living in the same home as a person with confirmed diagnosis of COVID-19 or a PUI (household contact)?    X   Have you been diagnosed with COVID-19?    X              What to do next: Answered NO to all: Answered YES to anything:   Proceed with unit schedule Follow the [facility] Inpatient Flowsheet.   

## 2020-02-09 NOTE — [facility] Group Notes (Signed)
[facility] LCSW Group Therapy Note  02/09/2020    Type of Therapy and Topic:  Group Therapy:  A Hero Worthy of Support  Participation Level:  Minimal   Description of Group:  Patients in this group were introduced to the concept that additional supports including self-support are an essential part of recovery.  Matching needs with supports to help fulfill those needs was explained.  Establishing boundaries that can gradually be increased or decreased was described, with patients giving their own examples of establishing appropriate boundaries in their lives.  A song entitled "My Own Hero" was played and a group discussion ensued in which patients stated it inspired them to help themselves in order to succeed, because other people cannot achieve their goals such as sobriety or stability for them.  A song was played called "I Am Enough" which led to a discussion about being willing to believe we are worth the effort of being a self-support.   Therapeutic Goals: 1)  demonstrate the importance of being a key part of one's own support system 2)  discuss various available supports 3)  encourage patient to use music as part of their self-support and focus on goals 4)  elicit ideas from patients about supports that need to be added   Summary of Patient Progress:  The patient expressed that his family is a healthy support while his job is unhealthy because it is not what he wants to do.  He did not participate much in group other than when called on directly.  Therapeutic Modalities:   Motivational Interviewing Activity  Lynnell Chad

## 2020-02-09 NOTE — Progress Notes (Signed)
   02/09/20 2311  COVID-19 Daily Checkoff  Have you had a fever (temp > 37.80C/100F)  in the past 24 hours?  No  If you have had runny nose, nasal congestion, sneezing in the past 24 hours, has it worsened? No  COVID-19 EXPOSURE  Have you traveled outside the state in the past 14 days? No  Have you been in contact with someone with a confirmed diagnosis of COVID-19 or PUI in the past 14 days without wearing appropriate PPE? No  Have you been living in the same home as a person with confirmed diagnosis of COVID-19 or a PUI (household contact)? No  Have you been diagnosed with COVID-19? No

## 2020-02-10 ENCOUNTER — Other Ambulatory Visit (HOSPITAL_COMMUNITY): Payer: Self-pay | Admitting: Psychiatric/Mental Health

## 2020-02-10 DIAGNOSIS — T50902A Poisoning by unspecified drugs, medicaments and biological substances, intentional self-harm, initial encounter: Secondary | ICD-10-CM | POA: Diagnosis not present

## 2020-02-10 MED ORDER — QUETIAPINE FUMARATE 50 MG PO TABS: 50.0000 mg | ORAL_TABLET | Freq: Every day | ORAL | 0 refills | Status: DC

## 2020-02-10 MED ORDER — NICOTINE 21 MG/24HR TD PT24: 21.0000 mg | MEDICATED_PATCH | Freq: Every day | TRANSDERMAL | 0 refills | Status: AC

## 2020-02-10 MED ORDER — VENLAFAXINE HCL ER 75 MG PO CP24: 75.0000 mg | ORAL_CAPSULE | Freq: Every day | ORAL | 0 refills | Status: DC

## 2020-02-10 MED ORDER — METOPROLOL SUCCINATE ER 50 MG PO TB24: 50.0000 mg | ORAL_TABLET | Freq: Every day | ORAL | 0 refills | Status: DC

## 2020-02-10 MED FILL — METOPROLOL SUCCINATE ER 50: 50 | 30 days supply | Qty: 30 | Fill #0

## 2020-02-10 MED FILL — VENLAFAXINE HCL ER 75 MG CA: 75 | 30 days supply | Qty: 30 | Fill #0

## 2020-02-10 MED FILL — QUETIAPINE FUMARATE 50 MG T: 50 | 30 days supply | Qty: 30 | Fill #0

## 2020-02-10 MED FILL — SM NICOTINE 21 MG/24HR PT24: 21 | 28 days supply | Qty: 28 | Fill #0

## 2020-02-10 NOTE — Progress Notes (Signed)
Recreation Therapy Notes  Date:  1.22.20 Time: 0930 Location: [address] Dayroom  Group Topic: Stress Management  Goal Area(s) Addresses:  Patient will identify positive stress management techniques. Patient will identify benefits of using stress management post d/c.  Behavioral Response: Engaged  Intervention: Stress Management  Activity :  Meditation.  LRT played a meditation that guided patients through a body scan to take inventory of any sensations they may have been feeling at the time.  Patients were to listen and follow along as meditation played to fully engage.  Education:  Stress Management, Discharge Planning.   Education Outcome: Acknowledges Education  Clinical Observations/Feedback: Pt attended and participated in group session.      Caroll Rancher, LRT/CTRS         Caroll Rancher A 02/10/2020 12:18 PM
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
## 2020-02-10 NOTE — Plan of Care (Signed)
  Problem: Education: Goal: Knowledge of disease or condition will improve Outcome: Adequate for Discharge Goal: Understanding of discharge needs will improve Outcome: Adequate for Discharge   Problem: Health Behavior/Discharge Planning: Goal: Ability to identify changes in lifestyle to reduce recurrence of condition will improve Outcome: Adequate for Discharge Goal: Identification of resources available to assist in meeting health care needs will improve Outcome: Adequate for Discharge   Problem: Physical Regulation: Goal: Complications related to the disease process, condition or treatment will be avoided or minimized Outcome: Adequate for Discharge   Problem: Safety: Goal: Ability to remain free from injury will improve Outcome: Adequate for Discharge   Problem: Education: Goal: Utilization of techniques to improve thought processes will improve Outcome: Adequate for Discharge Goal: Knowledge of the prescribed therapeutic regimen will improve Outcome: Adequate for Discharge   Problem: Activity: Goal: Interest or engagement in leisure activities will improve Outcome: Adequate for Discharge Goal: Imbalance in normal sleep/wake cycle will improve Outcome: Adequate for Discharge   Problem: Coping: Goal: Coping ability will improve Outcome: Adequate for Discharge Goal: Will verbalize feelings Outcome: Adequate for Discharge   Problem: Health Behavior/Discharge Planning: Goal: Ability to make decisions will improve Outcome: Adequate for Discharge Goal: Compliance with therapeutic regimen will improve Outcome: Adequate for Discharge   Problem: Role Relationship: Goal: Will demonstrate positive changes in social behaviors and relationships Outcome: Adequate for Discharge   Problem: Safety: Goal: Ability to disclose and discuss suicidal ideas will improve Outcome: Adequate for Discharge Goal: Ability to identify and utilize support systems that promote safety will  improve Outcome: Adequate for Discharge   Problem: Self-Concept: Goal: Will verbalize positive feelings about self Outcome: Adequate for Discharge Goal: Level of anxiety will decrease Outcome: Adequate for Discharge  Patient discharged to home/selfcare

## 2020-02-10 NOTE — Progress Notes (Signed)
  [facility] Adult Case Management Discharge Plan :  Will you be returning to the same living situation after discharge:  Yes,  to mother's home At discharge, do you have transportation home?: Yes,  via mother Do you have the ability to pay for your medications: Yes,  has private insurance  Release of information consent forms completed and in the chart;  Patient's signature needed at discharge.  Patient to Follow up at:  Follow-up Information    [facility]. Go on 02/11/2020.   Why: You have an appointment on 02/11/20 at 8:00 am medication management services.  This appointment will be held in person.  Please bring your insurance card and photo ID.  They also provide therapy services. Contact information: [address] [place] [address] [phone]        [facility]. Go on 02/17/2020.   Why: You have an appointment to establish care with this provider, on 02/17/20 at 9:45 am. This appointment will be held in person.  Please bring your insurance card with you.  Contact information: [address] [place] [place],  [place]  [address]  P: [phone] F: [phone]              Next level of care provider has access to [facility] Link:no  Safety Planning and Suicide Prevention discussed: Yes,  [facility] [phone]     Has patient been referred to the Quitline?: Patient refused referral  Patient has been referred for addiction treatment: N/A  Felizardo Hoffmann, LCSWA 02/10/2020, 11:00 AM

## 2020-02-10 NOTE — [facility] Suicide Risk Assessment (Signed)
[facility] Discharge Suicide Risk Assessment   Principal Problem: MDD (major depressive disorder), recurrent severe, without psychosis (HCC) Discharge Diagnoses: Principal Problem:   MDD (major depressive disorder), recurrent severe, without psychosis (HCC)   Total Time spent with patient: 20 minutes  Musculoskeletal: Strength & Muscle Tone: within normal limits Gait & Station: normal Patient leans: N/A  Psychiatric Specialty Exam: Review of Systems  Blood pressure (!) 157/120, pulse 92, temperature 98.2 F (36.8 C), temperature source Oral, resp. rate 16, height 5\' 6"  (1.676 m), weight 111.1 kg, SpO2 100 %.Body mass index is 39.54 kg/m.  General Appearance: Negative and Casual  Eye Contact::  Fair  Speech:  Clear and Coherent  Volume:  Normal  Mood:  Euthymic  Affect:  Appropriate  Thought Process:  Coherent  Orientation:  Full (Time, Place, and Person)  Thought Content:  Logical  Suicidal Thoughts:  No  Homicidal Thoughts:  No  Memory:  Recent;   Fair  Judgement:  Fair  Insight:  Fair  Psychomotor Activity:  Normal  Concentration:  Fair  Recall:  [phone] of Knowledge:Fair  Language: Fair  Akathisia:  No  Handed:  Right  AIMS (if indicated):     Assets:  Communication Skills Desire for Improvement Housing Leisure Time Physical Health  Sleep:  Number of Hours: 6.75  Cognition: WNL  ADL's:  Intact   Mental Status Per Nursing Assessment::   On Admission:  Suicidal ideation indicated by patient  Demographic Factors:  Male  Loss Factors: NA  Historical Factors: Impulsivity  Risk Reduction Factors:   Sense of responsibility to family, Positive social support, Positive therapeutic relationship and Positive coping skills or problem solving skills  Continued Clinical Symptoms:  Alcohol/Substance Abuse/Dependencies Previous Psychiatric Diagnoses and Treatments  Cognitive Features That Contribute To Risk:  None    Suicide Risk:  Mild:  Suicidal ideation of  limited frequency, intensity, duration, and specificity.  There are no identifiable plans, no associated intent, mild dysphoria and related symptoms, good self-control (both objective and subjective assessment), few other risk factors, and identifiable protective factors, including available and accessible social support.   Follow-up Information    [facility]. Go on 02/11/2020.   Why: You have an appointment on 02/11/20 at 8:00 am medication management services.  This appointment will be held in person.  Please bring your insurance card and photo ID.  They also provide therapy services. Contact information: [address] [place] [phone]        [facility]. Go on 02/17/2020.   Why: You have an appointment to establish care with this provider, on 02/17/20 at 9:45 am. This appointment will be held in person.  Please bring your insurance card with you.  Contact information: [address] [place] [place],  [place]  [place]  P: [phone] F: [phone]              Plan Of Care/Follow-up recommendations:  Other:  Follow-up with outpatient care  [phone], MD 02/10/2020, 10:55 AM

## 2020-02-10 NOTE — Progress Notes (Signed)
Spiritual care group on grief and loss facilitated by chaplain Burnis Kingfisher  Group Goal:  Support / Education around grief and loss  Members engage in facilitated group support, process oriented grief support, and psycho-social education.  Group Description:  Following introductions and group rules, group members engaged in facilitated group dialog and support around topic of loss, with particular support around experiences of loss in their lives. Group Identified types of loss (relationships / self / things) and identified patterns, circumstances, and changes that precipitate losses. Reflected on thoughts / feelings around loss, normalized grief responses, processed grief as part of journey at [facility], and recognized variety in grief experience.  Reflected on grief through Worden's tasks of grief.    Group drew on Adlerian and Narrative frameworks   Patient Progress: Samuel Tate was present throughout group.  Was attentive to group conversation as evidenced by eye contact and appropriate responses in body language.  He did not engage in group discussion.

## 2020-02-11 DIAGNOSIS — F329 Major depressive disorder, single episode, unspecified: Secondary | ICD-10-CM | POA: Diagnosis not present

## 2020-02-17 ENCOUNTER — Other Ambulatory Visit: Payer: Self-pay

## 2020-02-17 ENCOUNTER — Ambulatory Visit: Payer: BC Managed Care – PPO | Admitting: Nurse Practitioner

## 2020-02-17 ENCOUNTER — Encounter: Payer: Self-pay | Admitting: Nurse Practitioner

## 2020-02-17 VITALS — BP 135/91 | HR 100 | Temp 99.0°F | Resp 20 | Ht 66.0 in | Wt 237.0 lb

## 2020-02-17 DIAGNOSIS — Z7689 Persons encountering health services in other specified circumstances: Secondary | ICD-10-CM

## 2020-02-17 DIAGNOSIS — F332 Major depressive disorder, recurrent severe without psychotic features: Secondary | ICD-10-CM

## 2020-02-17 DIAGNOSIS — Z96643 Presence of artificial hip joint, bilateral: Secondary | ICD-10-CM

## 2020-02-17 DIAGNOSIS — I10 Essential (primary) hypertension: Secondary | ICD-10-CM | POA: Diagnosis not present

## 2020-02-17 DIAGNOSIS — F419 Anxiety disorder, unspecified: Secondary | ICD-10-CM | POA: Insufficient documentation

## 2020-02-17 DIAGNOSIS — K219 Gastro-esophageal reflux disease without esophagitis: Secondary | ICD-10-CM

## 2020-02-17 DIAGNOSIS — F32A Depression, unspecified: Secondary | ICD-10-CM

## 2020-02-17 NOTE — Assessment & Plan Note (Addendum)
-  taking venlafaxine 75 mg daily -taking quetiapine 50 mg qhs -had recent hospitalization for suicidal ideation, and he states he is in a much better place mentally today -he states his issue with is girlfriend has resolved -he states he is still struggling with job-related stress; we discussed that he should still have his f/u appt. with [facility] for med review and to set up counseling

## 2020-02-17 NOTE — Assessment & Plan Note (Signed)
-  no issues today -takes omeprazole 20 mg daily -drinking contributory?

## 2020-02-17 NOTE — Assessment & Plan Note (Signed)
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

## 2020-02-17 NOTE — Assessment & Plan Note (Signed)
-  taking HCTZ 50 mg daily -taking losartan 100 mg daily -taking metoprolol XL 50 mg daily -BP controlled today???

## 2020-02-17 NOTE — Progress Notes (Signed)
New Patient Office Visit  Subjective:  Patient ID: Samuel Tate, male    DOB: 1990-06-13  Age: 29 y.o. MRN: [id number]  CC:  Chief Complaint  Patient presents with  . New Patient (Initial Visit)    HPI Samuel Tate presents to establish care. He has not had a recent PCP. No recent PCP physical and labs.  He had surgery Dec. 2020 for bilateral hip replacement, and had labs completed about 2 weeks ago at behavioral health in [place].  He had intentional medication overdose suicide attempt, and states it was related to girlfriend problems and stress at work. He was recently established with [facility] for psychiatric support.  Past Medical History:  Diagnosis Date  . Anxiety   . Avascular necrosis of bones of both hips (HCC) 02/28/2019  . Depression   . Dyspnea    with panic attacks. uses inhaler  . Hypertension     Past Surgical History:  Procedure Laterality Date  . BILATERAL ANTERIOR TOTAL HIP ARTHROPLASTY Bilateral 03/01/2019   Procedure: BILATERAL ANTERIOR TOTAL HIP ARTHROPLASTY;  Surgeon: Kathryne Hitch, MD;  Location: [facility];  Service: Orthopedics;  Laterality: Bilateral;  . FRACTURE SURGERY Right 2010    Family History  Problem Relation Age of Onset  . Healthy Mother   . Healthy Father     Social History   Socioeconomic History  . Marital status: Divorced    Spouse name: Not on file  . Number of children: 0  . Years of education: Not on file  . Highest education level: Not on file  Occupational History  . Occupation: [employer]    Comment: Ship broker  Tobacco Use  . Smoking status: Current Some Day Smoker    Years: 4.00    Types: Cigars  . Smokeless tobacco: Current User    Types: Snuff  Vaping Use  . Vaping Use: Never used  Substance and Sexual Activity  . Alcohol use: Yes    Alcohol/week: 2.0 - 4.0 standard drinks    Types: 2 - 4 Cans of beer per week    Comment: daily  . Drug use: Never  . Sexual activity: Not Currently  Other Topics  Concern  . Not on file  Social History Narrative  . Not on file   Social Determinants of Health   Financial Resource Strain:   . Difficulty of Paying Living Expenses: Not on file  Food Insecurity:   . Worried About Programme researcher, broadcasting/film/video in the Last Year: Not on file  . Ran Out of Food in the Last Year: Not on file  Transportation Needs:   . Lack of Transportation (Medical): Not on file  . Lack of Transportation (Non-Medical): Not on file  Physical Activity:   . Days of Exercise per Week: Not on file  . Minutes of Exercise per Session: Not on file  Stress:   . Feeling of Stress : Not on file  Social Connections:   . Frequency of Communication with Friends and Family: Not on file  . Frequency of Social Gatherings with Friends and Family: Not on file  . Attends Religious Services: Not on file  . Active Member of Clubs or Organizations: Not on file  . Attends Banker Meetings: Not on file  . Marital Status: Not on file  Intimate Partner Violence:   . Fear of Current or Ex-Partner: Not on file  . Emotionally Abused: Not on file  . Physically Abused: Not on file  . Sexually Abused: Not  on file    ROS Review of Systems  Constitutional: Negative.   Respiratory: Negative.   Cardiovascular: Negative.   Musculoskeletal: Negative.   Psychiatric/Behavioral: Negative for sleep disturbance and suicidal ideas. The patient is not nervous/anxious.        Was recently admitted to [facility] for suicide attempt by overdose; denies SI, HI, or psychosis today    Objective:   Today's Vitals: BP (!) 135/91   Pulse 100   Temp 99 F (37.2 C)   Resp 20   Ht 5\' 6"  (1.676 m)   Wt 237 lb (107.5 kg)   SpO2 98%   BMI 38.25 kg/m   Physical Exam Constitutional:      Appearance: He is obese.  Cardiovascular:     Rate and Rhythm: Normal rate and regular rhythm.     Pulses: Normal pulses.     Heart sounds: Normal heart sounds.  Pulmonary:     Effort: Pulmonary effort is normal.      Breath sounds: Normal breath sounds.  Neurological:     Mental Status: He is alert.  Psychiatric:        Mood and Affect: Mood normal.        Behavior: Behavior normal.        Thought Content: Thought content normal.        Judgment: Judgment normal.     Assessment & Plan:   Problem List Items Addressed This Visit      Cardiovascular and Mediastinum   Essential hypertension    -taking HCTZ 50 mg daily -taking losartan 100 mg daily -taking metoprolol XL 50 mg daily -BP controlled today???        Digestive   Gastroesophageal reflux disease without esophagitis    -no issues today -takes omeprazole 20 mg daily -drinking contributory?        Other   Status post bilateral total hip replacement    -had avascular necrosis of his hips requiring hip replacement -this was completed in May 2021      MDD (major depressive disorder), recurrent severe, without psychosis (HCC)    -taking venlafaxine 75 mg daily -taking quetiapine 50 mg qhs -had recent hospitalization for suicidal ideation, and he states he is in a much better place mentally today -he states his issue with is girlfriend has resolved -he states he is still struggling with job-related stress; we discussed that he should still have his f/u appt. with [facility] for med review and to set up counseling        RESOLVED: Anxiety and depression - Primary      Outpatient Encounter Medications as of 02/17/2020  Medication Sig  . hydrochlorothiazide (HYDRODIURIL) 50 MG tablet Take 1 tablet (50 mg total) by mouth daily.  02/19/2020 losartan (COZAAR) 100 MG tablet Take 1 tablet (100 mg total) by mouth daily.  . metoprolol succinate (TOPROL-XL) 50 MG 24 hr tablet Take 1 tablet (50 mg total) by mouth daily. Take with or immediately following a meal.  . omeprazole (PRILOSEC) 20 MG capsule Take 1 capsule (20 mg total) by mouth daily.  . QUEtiapine (SEROQUEL) 50 MG tablet Take 1 tablet (50 mg total) by mouth at bedtime.  Marland Kitchen venlafaxine XR  (EFFEXOR-XR) 75 MG 24 hr capsule Take 1 capsule (75 mg total) by mouth daily with breakfast.  . nicotine (NICODERM CQ - DOSED IN MG/24 HOURS) 21 mg/24hr patch Place 1 patch (21 mg total) onto the skin daily. (Patient not taking: Reported on 02/17/2020)  . [DISCONTINUED] losartan-hydrochlorothiazide (  HYZAAR) 100-25 MG tablet Take 1 tablet by mouth daily.   No facility-administered encounter medications on file as of 02/17/2020.    Follow-up: Return in about 2 weeks (around 03/02/2020) for Annual Physical Exam.   Heather Roberts, NP

## 2020-02-20 DIAGNOSIS — F329 Major depressive disorder, single episode, unspecified: Secondary | ICD-10-CM | POA: Diagnosis not present

## 2020-03-02 ENCOUNTER — Encounter: Payer: 59 | Admitting: Nurse Practitioner

## 2020-03-05 ENCOUNTER — Other Ambulatory Visit: Payer: Self-pay

## 2020-03-05 ENCOUNTER — Encounter: Payer: Self-pay | Admitting: Nurse Practitioner

## 2020-03-05 ENCOUNTER — Ambulatory Visit (INDEPENDENT_AMBULATORY_CARE_PROVIDER_SITE_OTHER): Payer: BC Managed Care – PPO | Admitting: Nurse Practitioner

## 2020-03-05 VITALS — BP 155/100 | HR 96 | Temp 98.8°F | Resp 18 | Ht 66.0 in | Wt 237.0 lb

## 2020-03-05 DIAGNOSIS — F332 Major depressive disorder, recurrent severe without psychotic features: Secondary | ICD-10-CM | POA: Diagnosis not present

## 2020-03-05 DIAGNOSIS — I10 Essential (primary) hypertension: Secondary | ICD-10-CM

## 2020-03-05 DIAGNOSIS — Z96643 Presence of artificial hip joint, bilateral: Secondary | ICD-10-CM

## 2020-03-05 DIAGNOSIS — K219 Gastro-esophageal reflux disease without esophagitis: Secondary | ICD-10-CM | POA: Diagnosis not present

## 2020-03-05 MED ORDER — AMLODIPINE BESYLATE 5 MG PO TABS
5.0000 mg | ORAL_TABLET | Freq: Every day | ORAL | 1 refills | Status: DC
Start: 2020-03-05 — End: 2020-03-11

## 2020-03-05 MED FILL — AMLODIPINE BESYLATE 5 MG TA: 5 | 30 days supply | Qty: 30 | Fill #0

## 2020-03-05 NOTE — Assessment & Plan Note (Signed)
-  no issues today -takes omeprazole 20 mg daily 

## 2020-03-05 NOTE — Assessment & Plan Note (Addendum)
-  taking venlafaxine 75 mg daily -[facility] increased his quetiapine to 100 mg qhs -had recent hospitalization for suicidal ideation, and is in a much better place mentally today -he states his issue with is girlfriend has resolved -he states he is still struggling with job-related stress; we discussed that he should still have his f/u appt. with [facility] for med review and to set up counseling

## 2020-03-05 NOTE — Assessment & Plan Note (Addendum)
-  BP elevated today -taking HCTZ 50 mg daily -taking losartan 100 mg daily -taking metoprolol XL 50 mg qhs -Rx. Amlodipine -we discussed EtOH withdrawal as a possible etiology of HTN, but he states he is drinking 2-4 drinks per night and drank last night -We also discussed OSA as a possible etiology, if amlodipine does not help with BP, will consider referral for sleep study

## 2020-03-05 NOTE — Progress Notes (Signed)
Established Patient Office Visit  Subjective:  Patient ID: Samuel Tate, male    DOB: 1990-10-16  Age: 29 y.o. MRN: [id number]  CC:  Chief Complaint  Patient presents with  . Annual Exam    HPI Samuel Tate presents for annual physical exam. No acute complaints. He states he has had HTN since he was in high school, and his BP was running 180/120s then.  BP elevated today, but he states he has been taking lisinopril and HCTZ in the AM and metoprolol in the evening.  Past Medical History:  Diagnosis Date  . Anxiety   . Avascular necrosis of bones of both hips (HCC) 02/28/2019  . Depression   . Dyspnea    with panic attacks. uses inhaler  . Hypertension     Past Surgical History:  Procedure Laterality Date  . BILATERAL ANTERIOR TOTAL HIP ARTHROPLASTY Bilateral 03/01/2019   Procedure: BILATERAL ANTERIOR TOTAL HIP ARTHROPLASTY;  Surgeon: Kathryne Hitch, MD;  Location: [facility];  Service: Orthopedics;  Laterality: Bilateral;  . FRACTURE SURGERY Right 2010    Family History  Problem Relation Age of Onset  . Healthy Mother   . Healthy Father     Social History   Socioeconomic History  . Marital status: Divorced    Spouse name: Not on file  . Number of children: 0  . Years of education: Not on file  . Highest education level: Not on file  Occupational History  . Occupation: [employer]    Comment: Ship broker  Tobacco Use  . Smoking status: Current Some Day Smoker    Years: 4.00    Types: Cigars  . Smokeless tobacco: Current User    Types: Snuff  Vaping Use  . Vaping Use: Never used  Substance and Sexual Activity  . Alcohol use: Yes    Alcohol/week: 2.0 - 4.0 standard drinks    Types: 2 - 4 Cans of beer per week    Comment: daily  . Drug use: Never  . Sexual activity: Not Currently  Other Topics Concern  . Not on file  Social History Narrative  . Not on file   Social Determinants of Health   Financial Resource Strain: Not on file  Food Insecurity:  Not on file  Transportation Needs: Not on file  Physical Activity: Not on file  Stress: Not on file  Social Connections: Not on file  Intimate Partner Violence: Not on file    Outpatient Medications Prior to Visit  Medication Sig Dispense Refill  . hydrochlorothiazide (HYDRODIURIL) 50 MG tablet Take 1 tablet (50 mg total) by mouth daily. 90 tablet 3  . losartan (COZAAR) 100 MG tablet Take 1 tablet (100 mg total) by mouth daily. 90 tablet 3  . metoprolol succinate (TOPROL-XL) 50 MG 24 hr tablet Take 1 tablet (50 mg total) by mouth daily. Take with or immediately following a meal. 30 tablet 0  . nicotine (NICODERM CQ - DOSED IN MG/24 HOURS) 21 mg/24hr patch Place 1 patch (21 mg total) onto the skin daily. 28 patch 0  . omeprazole (PRILOSEC) 20 MG capsule Take 1 capsule (20 mg total) by mouth daily. 90 capsule 3  . QUEtiapine (SEROQUEL) 50 MG tablet Take 1 tablet (50 mg total) by mouth at bedtime. 30 tablet 0  . venlafaxine XR (EFFEXOR-XR) 75 MG 24 hr capsule Take 1 capsule (75 mg total) by mouth daily with breakfast. 30 capsule 0   No facility-administered medications prior to visit.    No Known Allergies  ROS Review of Systems  Constitutional: Negative.   HENT: Negative.   Eyes: Negative.   Respiratory: Negative.   Cardiovascular: Negative.   Gastrointestinal: Negative.   Endocrine: Negative.   Genitourinary: Negative.   Musculoskeletal: Negative.   Allergic/Immunologic: Negative.   Neurological: Negative.   Hematological: Negative.   Psychiatric/Behavioral: Negative.       Objective:    Physical Exam Constitutional:      Appearance: He is obese.  HENT:     Head: Normocephalic and atraumatic.     Right Ear: Tympanic membrane, ear canal and external ear normal.     Left Ear: Tympanic membrane, ear canal and external ear normal.     Ears:     Comments: Cerumen to right ear    Nose: Nose normal.     Mouth/Throat:     Mouth: Mucous membranes are moist.     Pharynx:  Oropharynx is clear.  Eyes:     Extraocular Movements: Extraocular movements intact.     Conjunctiva/sclera: Conjunctivae normal.     Pupils: Pupils are equal, round, and reactive to light.  Cardiovascular:     Rate and Rhythm: Normal rate and regular rhythm.     Pulses: Normal pulses.     Heart sounds: Normal heart sounds.  Pulmonary:     Effort: Pulmonary effort is normal.     Breath sounds: Normal breath sounds.  Abdominal:     General: Abdomen is flat. Bowel sounds are normal. There is no distension.     Palpations: Abdomen is soft. There is no mass.     Tenderness: There is no abdominal tenderness. There is no guarding or rebound.     Hernia: No hernia is present.  Musculoskeletal:        General: Normal range of motion.     Cervical back: Normal range of motion and neck supple.  Skin:    General: Skin is warm and dry.     Capillary Refill: Capillary refill takes less than 2 seconds.  Neurological:     General: No focal deficit present.     Mental Status: He is alert and oriented to person, place, and time. Mental status is at baseline.  Psychiatric:        Mood and Affect: Mood normal.        Behavior: Behavior normal.        Thought Content: Thought content normal.        Judgment: Judgment normal.     BP (!) 155/100   Pulse 96   Temp 98.8 F (37.1 C)   Resp 18   Ht 5\' 6"  (1.676 m)   Wt 237 lb (107.5 kg)   SpO2 97%   BMI 38.25 kg/m  Wt Readings from Last 3 Encounters:  03/05/20 237 lb (107.5 kg)  02/17/20 237 lb (107.5 kg)  02/01/20 245 lb (111.1 kg)     Health Maintenance Due  Topic Date Due  . Hepatitis C Screening  Never done    There are no preventive care reminders to display for this patient.  Lab Results  Component Value Date   TSH 1.845 02/03/2020   Lab Results  Component Value Date   WBC 9.6 02/02/2020   HGB 16.4 02/02/2020   HCT 51.1 02/02/2020   MCV 90.6 02/02/2020   PLT 531 (H) 02/02/2020   Lab Results  Component Value Date    NA 137 02/09/2020   K 3.5 02/09/2020   CO2 27 02/09/2020   GLUCOSE 118 (  H) 02/09/2020   BUN 11 02/09/2020   CREATININE 0.86 02/09/2020   BILITOT 1.6 (H) 02/09/2020   ALKPHOS 100 02/09/2020   AST 49 (H) 02/09/2020   ALT 82 (H) 02/09/2020   PROT 8.1 02/09/2020   ALBUMIN 4.4 02/09/2020   CALCIUM 9.1 02/09/2020   ANIONGAP 14 02/09/2020   Lab Results  Component Value Date   CHOL 154 02/03/2020   Lab Results  Component Value Date   HDL 42 02/03/2020   Lab Results  Component Value Date   LDLCALC 87 02/03/2020   Lab Results  Component Value Date   TRIG 124 02/03/2020   Lab Results  Component Value Date   CHOLHDL 3.7 02/03/2020   Lab Results  Component Value Date   HGBA1C 5.0 02/03/2020      Assessment & Plan:   Problem List Items Addressed This Visit      Cardiovascular and Mediastinum   Essential hypertension - Primary    -BP elevated today -taking HCTZ 50 mg daily -taking losartan 100 mg daily -taking metoprolol XL 50 mg qhs -Rx. Amlodipine -we discussed EtOH withdrawal as a possible etiology of HTN, but he states he is drinking 2-4 drinks per night and drank last night -We also discussed OSA as a possible etiology, if amlodipine does not help with BP, will consider referral for sleep study       Relevant Medications   amLODipine (NORVASC) 5 MG tablet     Digestive   Gastroesophageal reflux disease without esophagitis    -no issues today -takes omeprazole 20 mg daily         Other   Status post bilateral total hip replacement    -had avascular necrosis of his hips requiring hip replacement -this was completed in May 2021      MDD (major depressive disorder), recurrent severe, without psychosis (HCC)    -taking venlafaxine 75 mg daily -[facility] increased his quetiapine to 100 mg qhs -had recent hospitalization for suicidal ideation, and is in a much better place mentally today -he states his issue with is girlfriend has resolved -he states he is  still struggling with job-related stress; we discussed that he should still have his f/u appt. with [facility] for med review and to set up counseling           Meds ordered this encounter  Medications  . amLODipine (NORVASC) 5 MG tablet    Sig: Take 1 tablet (5 mg total) by mouth at bedtime.    Dispense:  30 tablet    Refill:  1    Follow-up: Return in about 2 weeks (around 03/19/2020) for BP check.    Heather Roberts, NP

## 2020-03-05 NOTE — Assessment & Plan Note (Signed)
-  had avascular necrosis of his hips requiring hip replacement -this was completed in May 2021

## 2020-03-05 NOTE — Patient Instructions (Signed)
DASH Eating Plan  DASH stands for "Dietary Approaches to Stop Hypertension." The DASH eating plan is a healthy eating plan that has been shown to reduce high blood pressure (hypertension). It may also reduce your risk for type 2 diabetes, heart disease, and stroke. The DASH eating plan may also help with weight loss.  What are tips for following this plan?  General guidelines   Avoid eating more than 2,300 mg (milligrams) of salt (sodium) a day. If you have hypertension, you may need to reduce your sodium intake to 1,500 mg a day.   Limit alcohol intake to no more than 1 drink a day for nonpregnant women and 2 drinks a day for men. One drink equals 12 oz of beer, 5 oz of wine, or 1 oz of hard liquor.   Work with your health care provider to maintain a healthy body weight or to lose weight. Ask what an ideal weight is for you.   Get at least 30 minutes of exercise that causes your heart to beat faster (aerobic exercise) most days of the week. Activities may include walking, swimming, or biking.   Work with your health care provider or diet and nutrition specialist (dietitian) to adjust your eating plan to your individual calorie needs.  Reading food labels   Check food labels for the amount of sodium per serving. Choose foods with less than 5 percent of the Daily Value of sodium. Generally, foods with less than 300 mg of sodium per serving fit into this eating plan.      To find whole grains, look for the word "whole" as the first word in the ingredient list.  Shopping   Buy products labeled as "low-sodium" or "no salt added."   Buy fresh foods. Avoid canned foods and premade or frozen meals.  Cooking   Avoid adding salt when cooking. Use salt-free seasonings or herbs instead of table salt or sea salt. Check with your health care provider or pharmacist before using salt substitutes.   Do not fry foods. Cook foods using healthy methods such as baking, boiling, grilling, and broiling instead.   Cook with  heart-healthy oils, such as olive, canola, soybean, or sunflower oil.  Meal planning       Eat a balanced diet that includes:  ? 5 or more servings of fruits and vegetables each day. At each meal, try to fill half of your plate with fruits and vegetables.  ? Up to 6-8 servings of whole grains each day.  ? Less than 6 oz of lean meat, poultry, or fish each day. A 3-oz serving of meat is about the same size as a deck of cards. One egg equals 1 oz.  ? 2 servings of low-fat dairy each day.  ? A serving of nuts, seeds, or beans 5 times each week.  ? Heart-healthy fats. Healthy fats called Omega-3 fatty acids are found in foods such as flaxseeds and coldwater fish, like sardines, salmon, and mackerel.   Limit how much you eat of the following:  ? Canned or prepackaged foods.  ? Food that is high in trans fat, such as fried foods.  ? Food that is high in saturated fat, such as fatty meat.  ? Sweets, desserts, sugary drinks, and other foods with added sugar.  ? Full-fat dairy products.   Do not salt foods before eating.   Try to eat at least 2 vegetarian meals each week.   Eat more home-cooked food and less restaurant, buffet, and   fast food.   When eating at a restaurant, ask that your food be prepared with less salt or no salt, if possible.  What foods are recommended?  The items listed may not be a complete list. Talk with your dietitian about what dietary choices are best for you.  Grains  Whole-grain or whole-wheat bread. Whole-grain or whole-wheat pasta. Brown rice. Oatmeal. Quinoa. Bulgur. Whole-grain and low-sodium cereals. Pita bread. Low-fat, low-sodium crackers. Whole-wheat flour tortillas.  Vegetables  Fresh or frozen vegetables (raw, steamed, roasted, or grilled). Low-sodium or reduced-sodium tomato and vegetable juice. Low-sodium or reduced-sodium tomato sauce and tomato paste. Low-sodium or reduced-sodium canned vegetables.  Fruits  All fresh, dried, or frozen fruit. Canned fruit in natural juice  (without added sugar).  Meat and other protein foods  Skinless chicken or turkey. Ground chicken or turkey. Pork with fat trimmed off. Fish and seafood. Egg whites. Dried beans, peas, or lentils. Unsalted nuts, nut butters, and seeds. Unsalted canned beans. Lean cuts of beef with fat trimmed off. Low-sodium, lean deli meat.  Dairy  Low-fat (1%) or fat-free (skim) milk. Fat-free, low-fat, or reduced-fat cheeses. Nonfat, low-sodium ricotta or cottage cheese. Low-fat or nonfat yogurt. Low-fat, low-sodium cheese.  Fats and oils  Soft margarine without trans fats. Vegetable oil. Low-fat, reduced-fat, or light mayonnaise and salad dressings (reduced-sodium). Canola, safflower, olive, soybean, and sunflower oils. Avocado.  Seasoning and other foods  Herbs. Spices. Seasoning mixes without salt. Unsalted popcorn and pretzels. Fat-free sweets.  What foods are not recommended?  The items listed may not be a complete list. Talk with your dietitian about what dietary choices are best for you.  Grains  Baked goods made with fat, such as croissants, muffins, or some breads. Dry pasta or rice meal packs.  Vegetables  Creamed or fried vegetables. Vegetables in a cheese sauce. Regular canned vegetables (not low-sodium or reduced-sodium). Regular canned tomato sauce and paste (not low-sodium or reduced-sodium). Regular tomato and vegetable juice (not low-sodium or reduced-sodium). Pickles. Olives.  Fruits  Canned fruit in a light or heavy syrup. Fried fruit. Fruit in cream or butter sauce.  Meat and other protein foods  Fatty cuts of meat. Ribs. Fried meat. Bacon. Sausage. Bologna and other processed lunch meats. Salami. Fatback. Hotdogs. Bratwurst. Salted nuts and seeds. Canned beans with added salt. Canned or smoked fish. Whole eggs or egg yolks. Chicken or turkey with skin.  Dairy  Whole or 2% milk, cream, and half-and-half. Whole or full-fat cream cheese. Whole-fat or sweetened yogurt. Full-fat cheese. Nondairy creamers. Whipped  toppings. Processed cheese and cheese spreads.  Fats and oils  Butter. Stick margarine. Lard. Shortening. Ghee. Bacon fat. Tropical oils, such as coconut, palm kernel, or palm oil.  Seasoning and other foods  Salted popcorn and pretzels. Onion salt, garlic salt, seasoned salt, table salt, and sea salt. Worcestershire sauce. Tartar sauce. Barbecue sauce. Teriyaki sauce. Soy sauce, including reduced-sodium. Steak sauce. Canned and packaged gravies. Fish sauce. Oyster sauce. Cocktail sauce. Horseradish that you find on the shelf. Ketchup. Mustard. Meat flavorings and tenderizers. Bouillon cubes. Hot sauce and Tabasco sauce. Premade or packaged marinades. Premade or packaged taco seasonings. Relishes. Regular salad dressings.  Where to find more information:   National Heart, Lung, and Blood Institute: www.nhlbi.nih.gov   American Heart Association: www.heart.org  Summary   The DASH eating plan is a healthy eating plan that has been shown to reduce high blood pressure (hypertension). It may also reduce your risk for type 2 diabetes, heart disease, and stroke.     With the DASH eating plan, you should limit salt (sodium) intake to 2,300 mg a day. If you have hypertension, you may need to reduce your sodium intake to 1,500 mg a day.   When on the DASH eating plan, aim to eat more fresh fruits and vegetables, whole grains, lean proteins, low-fat dairy, and heart-healthy fats.   Work with your health care provider or diet and nutrition specialist (dietitian) to adjust your eating plan to your individual calorie needs.  This information is not intended to replace advice given to you by your health care provider. Make sure you discuss any questions you have with your health care provider.

## 2020-03-11 ENCOUNTER — Other Ambulatory Visit: Payer: Self-pay

## 2020-03-11 DIAGNOSIS — I10 Essential (primary) hypertension: Secondary | ICD-10-CM

## 2020-03-11 MED ORDER — METOPROLOL SUCCINATE ER 50 MG PO TB24: 50.0000 mg | ORAL_TABLET | Freq: Every day | ORAL | 0 refills | Status: DC

## 2020-03-11 MED ORDER — AMLODIPINE BESYLATE 5 MG PO TABS: 5.0000 mg | ORAL_TABLET | Freq: Every day | ORAL | 1 refills | Status: AC

## 2020-03-24 ENCOUNTER — Ambulatory Visit: Payer: BC Managed Care – PPO | Admitting: Nurse Practitioner

## 2020-03-26 ENCOUNTER — Encounter: Payer: Self-pay | Admitting: Nurse Practitioner

## 2020-03-26 ENCOUNTER — Other Ambulatory Visit: Payer: Self-pay

## 2020-03-26 ENCOUNTER — Ambulatory Visit: Payer: BC Managed Care – PPO | Admitting: Nurse Practitioner

## 2020-03-26 VITALS — BP 161/105 | HR 99 | Temp 98.5°F | Resp 16 | Ht 66.0 in | Wt 247.0 lb

## 2020-03-26 DIAGNOSIS — I10 Essential (primary) hypertension: Secondary | ICD-10-CM | POA: Diagnosis not present

## 2020-03-26 DIAGNOSIS — I159 Secondary hypertension, unspecified: Secondary | ICD-10-CM

## 2020-03-26 NOTE — Assessment & Plan Note (Addendum)
-  he has had high BP since he was a teenager, managed by multiple doctors -He recently started amlodipine which was added to his HCTZ, losartan, and metoprolol -he is young to have such refractory HTN -he is obese and has a thick neck, will check for OSA -if no OSA present, will consider referral to HTN clinic -we discussed risks of uncontrolled high blood pressure including heart failure and renal failure

## 2020-03-26 NOTE — Progress Notes (Signed)
Acute Office Visit  Subjective:    Patient ID: Samuel Tate, male    DOB: 12/17/1990, 30 y.o.   MRN: [id number]  Chief Complaint  Patient presents with  . Hypertension  . Follow-up    HPI Patient is in today for BP check. At his last OV, BP was 155/100.  At that time, he was started on amlodipine in addition to his HCTZ, losartan, and metoprolol XL.  He started taking the amlodipine last week.  He is unsure if he snores at night.  He states his BP has been this high since he was a teenager.  He states that his BP gets corrected with medication and then his BP will gradually increase to previous levels.  Past Medical History:  Diagnosis Date  . Anxiety   . Avascular necrosis of bones of both hips (HCC) 02/28/2019  . Depression   . Dyspnea    with panic attacks. uses inhaler  . Hypertension     Past Surgical History:  Procedure Laterality Date  . BILATERAL ANTERIOR TOTAL HIP ARTHROPLASTY Bilateral 03/01/2019   Procedure: BILATERAL ANTERIOR TOTAL HIP ARTHROPLASTY;  Surgeon: Kathryne Hitch, MD;  Location: [facility];  Service: Orthopedics;  Laterality: Bilateral;  . FRACTURE SURGERY Right 2010    Family History  Problem Relation Age of Onset  . Healthy Mother   . Healthy Father     Social History   Socioeconomic History  . Marital status: Divorced    Spouse name: Not on file  . Number of children: 0  . Years of education: Not on file  . Highest education level: Not on file  Occupational History  . Occupation: [employer]    Comment: Ship broker  Tobacco Use  . Smoking status: Current Some Day Smoker    Years: 4.00    Types: Cigars  . Smokeless tobacco: Current User    Types: Snuff  Vaping Use  . Vaping Use: Never used  Substance and Sexual Activity  . Alcohol use: Yes    Alcohol/week: 2.0 - 4.0 standard drinks    Types: 2 - 4 Cans of beer per week    Comment: daily  . Drug use: Never  . Sexual activity: Not Currently  Other Topics Concern  . Not on  file  Social History Narrative  . Not on file   Social Determinants of Health   Financial Resource Strain: Not on file  Food Insecurity: Not on file  Transportation Needs: Not on file  Physical Activity: Not on file  Stress: Not on file  Social Connections: Not on file  Intimate Partner Violence: Not on file    Outpatient Medications Prior to Visit  Medication Sig Dispense Refill  . hydrochlorothiazide (HYDRODIURIL) 50 MG tablet Take 1 tablet (50 mg total) by mouth daily. 90 tablet 3  . losartan (COZAAR) 100 MG tablet Take 1 tablet (100 mg total) by mouth daily. 90 tablet 3  . metoprolol succinate (TOPROL-XL) 50 MG 24 hr tablet Take 1 tablet (50 mg total) by mouth daily. Take with or immediately following a meal. 30 tablet 0  . nicotine (NICODERM CQ - DOSED IN MG/24 HOURS) 21 mg/24hr patch Place 1 patch (21 mg total) onto the skin daily. 28 patch 0  . omeprazole (PRILOSEC) 20 MG capsule Take 1 capsule (20 mg total) by mouth daily. 90 capsule 3  . QUEtiapine (SEROQUEL) 50 MG tablet Take 1 tablet (50 mg total) by mouth at bedtime. 30 tablet 0  . venlafaxine XR (EFFEXOR-XR)  75 MG 24 hr capsule Take 1 capsule (75 mg total) by mouth daily with breakfast. 30 capsule 0  . amLODipine (NORVASC) 5 MG tablet Take 1 tablet (5 mg total) by mouth at bedtime. (Patient not taking: Reported on 03/26/2020) 30 tablet 1   No facility-administered medications prior to visit.    No Known Allergies  Review of Systems  Constitutional: Negative.   Respiratory: Negative.   Cardiovascular: Negative.        Objective:    Physical Exam Constitutional:      Appearance: He is obese.  Cardiovascular:     Rate and Rhythm: Normal rate and regular rhythm.     Pulses: Normal pulses.  Pulmonary:     Effort: Pulmonary effort is normal.     Breath sounds: Normal breath sounds.  Neurological:     Mental Status: He is alert.     BP (!) 161/105   Pulse 99   Temp 98.5 F (36.9 C)   Resp 16   Ht 5\' 6"   (1.676 m)   Wt 247 lb (112 kg)   SpO2 97%   BMI 39.87 kg/m  Wt Readings from Last 3 Encounters:  03/26/20 247 lb (112 kg)  03/05/20 237 lb (107.5 kg)  02/17/20 237 lb (107.5 kg)    Health Maintenance Due  Topic Date Due  . Hepatitis C Screening  Never done    There are no preventive care reminders to display for this patient.   Lab Results  Component Value Date   TSH 1.845 02/03/2020   Lab Results  Component Value Date   WBC 9.6 02/02/2020   HGB 16.4 02/02/2020   HCT 51.1 02/02/2020   MCV 90.6 02/02/2020   PLT 531 (H) 02/02/2020   Lab Results  Component Value Date   NA 137 02/09/2020   K 3.5 02/09/2020   CO2 27 02/09/2020   GLUCOSE 118 (H) 02/09/2020   BUN 11 02/09/2020   CREATININE 0.86 02/09/2020   BILITOT 1.6 (H) 02/09/2020   ALKPHOS 100 02/09/2020   AST 49 (H) 02/09/2020   ALT 82 (H) 02/09/2020   PROT 8.1 02/09/2020   ALBUMIN 4.4 02/09/2020   CALCIUM 9.1 02/09/2020   ANIONGAP 14 02/09/2020   Lab Results  Component Value Date   CHOL 154 02/03/2020   Lab Results  Component Value Date   HDL 42 02/03/2020   Lab Results  Component Value Date   LDLCALC 87 02/03/2020   Lab Results  Component Value Date   TRIG 124 02/03/2020   Lab Results  Component Value Date   CHOLHDL 3.7 02/03/2020   Lab Results  Component Value Date   HGBA1C 5.0 02/03/2020       Assessment & Plan:   Problem List Items Addressed This Visit      Cardiovascular and Mediastinum   Essential hypertension    -he has had high BP since he was a teenager, managed by multiple doctors -He recently started amlodipine which was added to his HCTZ, losartan, and metoprolol -he is young to have such refractory HTN -he is obese and has a thick neck, will check for OSA -if no OSA present, will consider referral to HTN clinic -we discussed risks of uncontrolled high blood pressure including heart failure and renal failure       Other Visit Diagnoses    Secondary hypertension    -   Primary   Relevant Orders   Ambulatory referral to Neurology       No  orders of the defined types were placed in this encounter.    Heather Roberts, NP

## 2020-03-26 NOTE — Patient Instructions (Addendum)
You were seen today for a blood pressure check. Your BP is still elevated despite taking 4 antihypertensives. You will get a call from Dr. Ronal Fear office for a sleep study to check for sleep apnea.  We will meet back up in 2 months for a lab follow-up and decide the next steps to take for your BP.

## 2020-04-17 ENCOUNTER — Telehealth: Payer: Self-pay | Admitting: Registered Nurse

## 2020-04-17 ENCOUNTER — Ambulatory Visit: Payer: 59 | Admitting: Registered Nurse

## 2020-04-17 NOTE — Telephone Encounter (Signed)
Called pt LVM for pt to c.b to reschedule appt that was cancelled for today as Provider not available

## 2020-04-17 NOTE — Telephone Encounter (Signed)
04/17/2020 - I SPOKE WITH PATIENT REGARDING HIS APPOINTMENT HE HAD WITH RICH MORROW TODAY DUE TO RICH BEING OUT OF THE OFFICE. MR. Rotunno SAID HE IS GOING TO ANOTHER PROVIDER IN  WHO IS CLOSER TO HIS HOME. MBC

## 2020-04-26 ENCOUNTER — Other Ambulatory Visit: Payer: Self-pay | Admitting: Nurse Practitioner

## 2020-04-26 DIAGNOSIS — I10 Essential (primary) hypertension: Secondary | ICD-10-CM

## 2020-05-14 DIAGNOSIS — I701 Atherosclerosis of renal artery: Secondary | ICD-10-CM | POA: Diagnosis not present

## 2020-05-14 DIAGNOSIS — G4733 Obstructive sleep apnea (adult) (pediatric): Secondary | ICD-10-CM | POA: Diagnosis not present

## 2020-05-14 DIAGNOSIS — I10 Essential (primary) hypertension: Secondary | ICD-10-CM | POA: Diagnosis not present

## 2020-05-14 DIAGNOSIS — R5383 Other fatigue: Secondary | ICD-10-CM | POA: Diagnosis not present

## 2020-05-20 DIAGNOSIS — F329 Major depressive disorder, single episode, unspecified: Secondary | ICD-10-CM | POA: Diagnosis not present

## 2020-05-25 ENCOUNTER — Other Ambulatory Visit: Payer: Self-pay | Admitting: Registered Nurse

## 2020-05-25 ENCOUNTER — Other Ambulatory Visit: Payer: Self-pay | Admitting: Nurse Practitioner

## 2020-05-25 DIAGNOSIS — K219 Gastro-esophageal reflux disease without esophagitis: Secondary | ICD-10-CM

## 2020-05-27 ENCOUNTER — Other Ambulatory Visit: Payer: Self-pay

## 2020-05-27 ENCOUNTER — Encounter: Payer: Self-pay | Admitting: Nurse Practitioner

## 2020-05-27 ENCOUNTER — Ambulatory Visit: Payer: 59 | Admitting: Nurse Practitioner

## 2020-05-27 VITALS — BP 182/130 | HR 92 | Temp 98.6°F | Resp 18 | Ht 66.0 in | Wt 239.0 lb

## 2020-05-27 DIAGNOSIS — I10 Essential (primary) hypertension: Secondary | ICD-10-CM

## 2020-05-27 DIAGNOSIS — K219 Gastro-esophageal reflux disease without esophagitis: Secondary | ICD-10-CM

## 2020-05-27 NOTE — Progress Notes (Signed)
Established Patient Office Visit  Subjective:  Patient ID: Samuel Tate, male    DOB: 10-23-1990  Age: 30 y.o. MRN: [id number]  CC:  Chief Complaint  Patient presents with  . Follow-up  . Hypertension    HPI Samuel Tate presents for BP check. At last OV, he had HTN, and he states that he has had HTN since he was a teenager. Hr has been taking losartan, HCTZ, and metoprolol, but his BP is still elevated. He saw Dr. Merlene Laughter who is setting him up for home sleep study. It looks like this process was started around 05/14/20. He states he has not heard anything about it.  Past Medical History:  Diagnosis Date  . Anxiety   . Avascular necrosis of bones of both hips ([place]) 02/28/2019  . Depression   . Dyspnea    with panic attacks. uses inhaler  . Hypertension     Past Surgical History:  Procedure Laterality Date  . BILATERAL ANTERIOR TOTAL HIP ARTHROPLASTY Bilateral 03/01/2019   Procedure: BILATERAL ANTERIOR TOTAL HIP ARTHROPLASTY;  Surgeon: Mcarthur Rossetti, MD;  Location: [facility];  Service: Orthopedics;  Laterality: Bilateral;  . FRACTURE SURGERY Right 2010    Family History  Problem Relation Age of Onset  . Healthy Mother   . Healthy Father     Social History   Socioeconomic History  . Marital status: Divorced    Spouse name: Not on file  . Number of children: 0  . Years of education: Not on file  . Highest education level: Not on file  Occupational History  . Occupation: [employer]    Comment: Systems developer  Tobacco Use  . Smoking status: Current Some Day Smoker    Years: 4.00    Types: Cigars  . Smokeless tobacco: Current User    Types: Snuff  Vaping Use  . Vaping Use: Never used  Substance and Sexual Activity  . Alcohol use: Yes    Alcohol/week: 2.0 - 4.0 standard drinks    Types: 2 - 4 Cans of beer per week    Comment: daily  . Drug use: Never  . Sexual activity: Not Currently  Other Topics Concern  . Not on file  Social History Narrative  .  Not on file   Social Determinants of Health   Financial Resource Strain: Not on file  Food Insecurity: Not on file  Transportation Needs: Not on file  Physical Activity: Not on file  Stress: Not on file  Social Connections: Not on file  Intimate Partner Violence: Not on file    Outpatient Medications Prior to Visit  Medication Sig Dispense Refill  . amLODipine (NORVASC) 5 MG tablet Take 1 tablet (5 mg total) by mouth at bedtime. 30 tablet 1  . hydrochlorothiazide (HYDRODIURIL) 50 MG tablet Take 1 tablet (50 mg total) by mouth daily. 90 tablet 3  . losartan (COZAAR) 100 MG tablet Take 1 tablet (100 mg total) by mouth daily. 90 tablet 3  . metoprolol succinate (TOPROL-XL) 50 MG 24 hr tablet TAKE 1 TABLET(50 MG) BY MOUTH DAILY WITH OR IMMEDIATELY FOLLOWING A MEAL 30 tablet 0  . nicotine (NICODERM CQ - DOSED IN MG/24 HOURS) 21 mg/24hr patch Place 1 patch (21 mg total) onto the skin daily. 28 patch 0  . omeprazole (PRILOSEC) 20 MG capsule TAKE 1 CAPSULE(20 MG) BY MOUTH DAILY 90 capsule 3  . QUEtiapine (SEROQUEL) 50 MG tablet Take 1 tablet (50 mg total) by mouth at bedtime. 30 tablet 0  .  venlafaxine XR (EFFEXOR-XR) 75 MG 24 hr capsule Take 1 capsule (75 mg total) by mouth daily with breakfast. 30 capsule 0   No facility-administered medications prior to visit.    No Known Allergies  ROS Review of Systems  Constitutional: Negative.   Respiratory: Negative.   Cardiovascular: Negative.   Neurological: Negative.       Objective:    Physical Exam Constitutional:      Appearance: He is obese.  Cardiovascular:     Rate and Rhythm: Normal rate and regular rhythm.     Pulses: Normal pulses.     Heart sounds: Normal heart sounds.  Pulmonary:     Effort: Pulmonary effort is normal.     Breath sounds: Normal breath sounds.  Neurological:     Mental Status: He is alert.  Psychiatric:        Mood and Affect: Mood normal.        Behavior: Behavior normal.        Thought Content:  Thought content normal.        Judgment: Judgment normal.     BP (!) 182/130   Pulse 92   Temp 98.6 F (37 C)   Resp 18   Ht '5\' 6"'  (1.676 m)   Wt 239 lb (108.4 kg)   SpO2 97%   BMI 38.58 kg/m  Wt Readings from Last 3 Encounters:  05/27/20 239 lb (108.4 kg)  03/26/20 247 lb (112 kg)  03/05/20 237 lb (107.5 kg)     Health Maintenance Due  Topic Date Due  . Hepatitis C Screening  Never done  . HIV Screening  Never done  . TETANUS/TDAP  Never done  . COVID-19 Vaccine (3 - Booster for Pfizer series) 01/02/2020    There are no preventive care reminders to display for this patient.  Lab Results  Component Value Date   TSH 1.845 02/03/2020   Lab Results  Component Value Date   WBC 9.6 02/02/2020   HGB 16.4 02/02/2020   HCT 51.1 02/02/2020   MCV 90.6 02/02/2020   PLT 531 (H) 02/02/2020   Lab Results  Component Value Date   NA 137 02/09/2020   K 3.5 02/09/2020   CO2 27 02/09/2020   GLUCOSE 118 (H) 02/09/2020   BUN 11 02/09/2020   CREATININE 0.86 02/09/2020   BILITOT 1.6 (H) 02/09/2020   ALKPHOS 100 02/09/2020   AST 49 (H) 02/09/2020   ALT 82 (H) 02/09/2020   PROT 8.1 02/09/2020   ALBUMIN 4.4 02/09/2020   CALCIUM 9.1 02/09/2020   ANIONGAP 14 02/09/2020   Lab Results  Component Value Date   CHOL 154 02/03/2020   Lab Results  Component Value Date   HDL 42 02/03/2020   Lab Results  Component Value Date   LDLCALC 87 02/03/2020   Lab Results  Component Value Date   TRIG 124 02/03/2020   Lab Results  Component Value Date   CHOLHDL 3.7 02/03/2020   Lab Results  Component Value Date   HGBA1C 5.0 02/03/2020      Assessment & Plan:   Problem List Items Addressed This Visit      Cardiovascular and Mediastinum   Essential hypertension - Primary    -he has had high BP since he was a teenager, managed by multiple doctors -He recently started amlodipine which was added to his HCTZ, losartan, and metoprolol -will check labs today -he is young to  have such refractory HTN -he is obese and has a thick neck,  will check for OSA; he was referred for sleep study, but states he hasn't heard anything about setting up sleep study -we discussed risks of uncontrolled high blood pressure including heart failure and renal failure -referral to HTN clinic      Relevant Orders   CBC with Differential/Platelet   CMP14+EGFR   Ambulatory referral to [facility] - [facility] [place]     Digestive   Gastroesophageal reflux disease without esophagitis    -no issues today         No orders of the defined types were placed in this encounter.   Follow-up: Return in about 6 months (around 11/27/2020) for Lab follow-up (HTN).    Noreene Larsson, NP

## 2020-05-27 NOTE — Assessment & Plan Note (Signed)
-  no issues today 

## 2020-05-27 NOTE — Assessment & Plan Note (Addendum)
-  he has had high BP since he was a teenager, managed by multiple doctors -He recently started amlodipine which was added to his HCTZ, losartan, and metoprolol -will check labs today -he is young to have such refractory HTN -he is obese and has a thick neck, will check for OSA; he was referred for sleep study, but states he hasn't heard anything about setting up sleep study -we discussed risks of uncontrolled high blood pressure including heart failure and renal failure -referral to HTN clinic

## 2020-05-27 NOTE — Patient Instructions (Signed)
For your high BP, I would contact Dr. Ronal Fear office to check on the status of the sleep study.  If they are not helpful, you could call [facility], the company that does home sleep studies to check on the status. Home Sleep Apnea Testing [address] [place], [place] [address] [phone] ([employer])  I sent in a referral to the hypertension clinic so we can get your blood pressure down before you have any long-lasting issues.

## 2020-05-28 LAB — CBC WITH DIFFERENTIAL/PLATELET
Basophils Absolute: 0.1 10*3/uL (ref 0.0–0.2)
Basos: 1 %
EOS (ABSOLUTE): 0.3 10*3/uL (ref 0.0–0.4)
Eos: 3 %
Hematocrit: 46.5 % (ref 37.5–51.0)
Hemoglobin: 15.8 g/dL (ref 13.0–17.7)
Immature Grans (Abs): 0 10*3/uL (ref 0.0–0.1)
Immature Granulocytes: 0 %
Lymphocytes Absolute: 2.5 10*3/uL (ref 0.7–3.1)
Lymphs: 24 %
MCH: 29.6 pg (ref 26.6–33.0)
MCHC: 34 g/dL (ref 31.5–35.7)
MCV: 87 fL (ref 79–97)
Monocytes Absolute: 0.8 10*3/uL (ref 0.1–0.9)
Monocytes: 8 %
Neutrophils Absolute: 6.5 10*3/uL (ref 1.4–7.0)
Neutrophils: 64 %
Platelets: 466 10*3/uL — ABNORMAL HIGH (ref 150–450)
RBC: 5.34 x10E6/uL (ref 4.14–5.80)
RDW: 12.4 % (ref 11.6–15.4)
WBC: 10.2 10*3/uL (ref 3.4–10.8)

## 2020-05-28 LAB — CMP14+EGFR
ALT: 66 IU/L — ABNORMAL HIGH (ref 0–44)
AST: 42 IU/L — ABNORMAL HIGH (ref 0–40)
Albumin/Globulin Ratio: 1.6 (ref 1.2–2.2)
Albumin: 4.9 g/dL (ref 4.1–5.2)
Alkaline Phosphatase: 126 IU/L — ABNORMAL HIGH (ref 44–121)
BUN/Creatinine Ratio: 6 — ABNORMAL LOW (ref 9–20)
BUN: 6 mg/dL (ref 6–20)
Bilirubin Total: 1.2 mg/dL (ref 0.0–1.2)
CO2: 25 mmol/L (ref 20–29)
Calcium: 9.7 mg/dL (ref 8.7–10.2)
Chloride: 96 mmol/L (ref 96–106)
Creatinine, Ser: 0.97 mg/dL (ref 0.76–1.27)
Globulin, Total: 3.1 g/dL (ref 1.5–4.5)
Glucose: 84 mg/dL (ref 65–99)
Potassium: 4 mmol/L (ref 3.5–5.2)
Sodium: 142 mmol/L (ref 134–144)
Total Protein: 8 g/dL (ref 6.0–8.5)
eGFR: 108 mL/min/{1.73_m2} (ref >59)

## 2020-05-28 NOTE — Progress Notes (Signed)
His labs are looking good. They are stable since the last visit.

## 2020-06-24 ENCOUNTER — Other Ambulatory Visit: Payer: Self-pay | Admitting: Nurse Practitioner

## 2020-06-24 DIAGNOSIS — I10 Essential (primary) hypertension: Secondary | ICD-10-CM

## 2020-06-25 ENCOUNTER — Encounter: Payer: Self-pay | Admitting: Cardiovascular Disease

## 2020-09-09 DIAGNOSIS — F329 Major depressive disorder, single episode, unspecified: Secondary | ICD-10-CM | POA: Diagnosis not present

## 2020-11-27 ENCOUNTER — Ambulatory Visit: Payer: BC Managed Care – PPO | Admitting: Nurse Practitioner

## 2020-12-18 ENCOUNTER — Other Ambulatory Visit: Payer: Self-pay

## 2020-12-18 ENCOUNTER — Ambulatory Visit: Payer: BC Managed Care – PPO | Admitting: Nurse Practitioner

## 2020-12-18 ENCOUNTER — Telehealth: Payer: Self-pay

## 2020-12-18 ENCOUNTER — Encounter: Payer: Self-pay | Admitting: Nurse Practitioner

## 2020-12-18 VITALS — BP 193/139 | HR 101 | Temp 98.9°F | Ht 66.0 in | Wt 228.0 lb

## 2020-12-18 DIAGNOSIS — Z139 Encounter for screening, unspecified: Secondary | ICD-10-CM | POA: Diagnosis not present

## 2020-12-18 DIAGNOSIS — I10 Essential (primary) hypertension: Secondary | ICD-10-CM | POA: Diagnosis not present

## 2020-12-18 NOTE — Patient Instructions (Signed)
Please have fasting labs drawn in the next week. 

## 2020-12-18 NOTE — Assessment & Plan Note (Signed)
-  BP significantly elevated -he admits to poor medication compliance -resume current meds, and we will recheck in 1 month -he would like me to send home sleep study order to Samuel Tate; he states she went to Dr. Ronal Fear office, but didn't follow-through with the sleep study

## 2020-12-18 NOTE — Progress Notes (Signed)
Acute Office Visit  Subjective:    Patient ID: Samuel Tate, male    DOB: May 25, 1990, 30 y.o.   MRN: [id number]  Chief Complaint  Patient presents with   Follow-up    6 month follow up    HPI Patient is in today for 100-monthfollow-up.  He has hx of HTN, and he states this has been ongoing since he was a teenager. He states he hasn't taken BP meds in the last week.  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 HE forgets to take his medications.  Past Medical History:  Diagnosis Date   Anxiety    Av[place] of bones of [place] hips [place] 02/28/2019   Depression    Dyspnea    with panic attacks. 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   Procedure: BILATERAL ANTERIOR TOTAL HIP ARTHROPLASTY;  Surgeon: BMcarthur Rossetti MD;  Location: [facility];  Service: Orthopedics;  Laterality: Bilateral;   FRACTURE SURGERY Right 2010    Family History  Problem Relation Age of Onset   Healthy Mother    Healthy Father     Social History   Socioeconomic History   Marital status: Divorced    Spouse name: Not on file   Number of children: 0   Years of education: Not on file   Highest education level: Not on file  Occupational History   Occupation: [employer]    Comment: tSystems developer Tobacco Use   Smoking status: Some Days    Types: Cigars   Smokeless tobacco: Current    Types: Snuff  Vaping Use   Vaping Use: Never used  Substance and Sexual Activity   Alcohol use: Yes    Alcohol/week: 2.0 - 4.0 standard drinks    Types: 2 - 4 Cans of beer per week    Comment: daily   Drug use: Never   Sexual activity: Not Currently  Other Topics Concern   Not on file  Social History Narrative   Not on file   Social Determinants of Health   Financial Resource Strain: Not on file  Food Insecurity: Not on file   Transportation Needs: Not on file  Physical Activity: Not on file  Stress: Not on file  Social Connections: Not on file  Intimate Partner Violence: Not on file    Outpatient Medications Prior to Visit  Medication Sig Dispense Refill   amLODipine (NORVASC) 5 MG tablet Take 1 tablet (5 mg total) by mouth at bedtime. 30 tablet 1   hydrochlorothiazide (HYDRODIURIL) 50 MG tablet Take 1 tablet (50 mg total) by mouth daily. 90 tablet 3   losartan (COZAAR) 100 MG tablet Take 1 tablet (100 mg total) by mouth daily. 90 tablet 3   metoprolol succinate (TOPROL-XL) 50 MG 24 hr tablet TAKE 1 TABLET(50 MG) BY MOUTH DAILY WITH OR IMMEDIATELY FOLLOWING A MEAL 30 tablet 0   nicotine (NICODERM CQ - DOSED IN MG/24 HOURS) 21 mg/24hr patch Place 1 patch (21 mg total) onto the skin daily. 28 patch 0   omeprazole (PRILOSEC) 20 MG capsule TAKE 1 CAPSULE(20 MG) BY MOUTH DAILY 90 capsule 3   QUEtiapine (SEROQUEL) 50 MG tablet TAKE 1 TABLET (50 MG TOTAL) BY MOUTH AT BEDTIME. 30 tablet 0   venlafaxine XR (EFFEXOR-XR) 75 MG 24 hr capsule TAKE 1 CAPSULE (75  MG TOTAL) BY MOUTH DAILY WITH BREAKFAST. 30 capsule 0   No facility-administered medications prior to visit.    No Known Allergies  Review of Systems  Constitutional: Negative.   Respiratory: Negative.    Cardiovascular: Negative.   Musculoskeletal: Negative.   Psychiatric/Behavioral: Negative.        Objective:    Physical Exam Constitutional:      Appearance: Normal appearance. He is obese.  Cardiovascular:     Rate and Rhythm: Normal rate and regular rhythm.     Pulses: Normal pulses.     Heart sounds: Normal heart sounds.  Pulmonary:     Effort: Pulmonary effort is normal.     Breath sounds: Normal breath sounds.  Musculoskeletal:        General: Normal range of motion.  Neurological:     Mental Status: He is alert.  Psychiatric:        Mood and Affect: Mood normal.        Behavior: Behavior normal.        Thought Content: Thought content  normal.        Judgment: Judgment normal.    BP (!) 193/139 (BP Location: Right Arm, Patient Position: Sitting, Cuff Size: Large)   Pulse (!) 101   Temp 98.9 F (37.2 C) (Oral)   Ht 5' 6" (1.676 m)   Wt 228 lb (103.4 kg)   SpO2 97%   BMI 36.80 kg/m  Wt Readings from Last 3 Encounters:  12/18/20 228 lb (103.4 kg)  05/27/20 239 lb (108.4 kg)  03/26/20 247 lb (112 kg)    Health Maintenance Due  Topic Date Due   HIV Screening  Never done   Hepatitis C Screening  Never done   TETANUS/TDAP  Never done   COVID-19 Vaccine (3 - Booster for Pfizer series) 12/03/2019   INFLUENZA VACCINE  Never done    There are no preventive care reminders to display for this patient.   Lab Results  Component Value Date   TSH 1.845 02/03/2020   Lab Results  Component Value Date   WBC 10.2 05/27/2020   HGB 15.8 05/27/2020   HCT 46.5 05/27/2020   MCV 87 05/27/2020   PLT 466 (H) 05/27/2020   Lab Results  Component Value Date   NA 142 05/27/2020   K 4.0 05/27/2020   CO2 25 05/27/2020   GLUCOSE 84 05/27/2020   BUN 6 05/27/2020   CREATININE 0.97 05/27/2020   BILITOT 1.2 05/27/2020   ALKPHOS 126 (H) 05/27/2020   AST 42 (H) 05/27/2020   ALT 66 (H) 05/27/2020   PROT 8.0 05/27/2020   ALBUMIN 4.9 05/27/2020   CALCIUM 9.7 05/27/2020   ANIONGAP 14 02/09/2020   EGFR 108 05/27/2020   Lab Results  Component Value Date   CHOL 154 02/03/2020   Lab Results  Component Value Date   HDL 42 02/03/2020   Lab Results  Component Value Date   LDLCALC 87 02/03/2020   Lab Results  Component Value Date   TRIG 124 02/03/2020   Lab Results  Component Value Date   CHOLHDL 3.7 02/03/2020   Lab Results  Component Value Date   HGBA1C 5.0 02/03/2020       Assessment & Plan:   Problem List Items Addressed This Visit       Cardiovascular and Mediastinum   Essential hypertension - Primary    -BP significantly elevated -he admits to poor medication compliance -resume current meds, and we  will recheck in 1   month -he would like me to send home sleep study order to [facility]; he states she went to Dr. Doon[place]ah's office, but didn't follow-through with the sleep study      Relevant Orders   CBC with Differential/Platelet   CMP14+EGFR   Lipid Panel With LDL/HDL Ratio   Home sleep test   Other Visit Diagnoses     Screening due       Relevant Orders   Hepatitis C antibody   HIV Antibody (routine testing w rflx)        No orders of the defined types were placed in this encounter.     M , NP  

## 2020-12-18 NOTE — Telephone Encounter (Signed)
Per Casimiro Needle in his notes Samuel Tate is requesting [facility] sleep study.  I have closed the Provider to Provider order since he did not follow up with [place].  Check with one of the clinic staff regarding [facility].  Not sure what this is.  It is not a regular referral.

## 2020-12-29 ENCOUNTER — Other Ambulatory Visit: Payer: Self-pay | Admitting: *Deleted

## 2020-12-29 DIAGNOSIS — G4730 Sleep apnea, unspecified: Secondary | ICD-10-CM

## 2020-12-29 NOTE — Telephone Encounter (Signed)
Sent to [facility] to go through [facility]

## 2020-12-30 DIAGNOSIS — F329 Major depressive disorder, single episode, unspecified: Secondary | ICD-10-CM | POA: Diagnosis not present

## 2020-12-31 ENCOUNTER — Other Ambulatory Visit: Payer: Self-pay | Admitting: Registered Nurse

## 2020-12-31 DIAGNOSIS — I10 Essential (primary) hypertension: Secondary | ICD-10-CM

## 2021-01-04 ENCOUNTER — Telehealth: Payer: Self-pay | Admitting: Nurse Practitioner

## 2021-01-04 ENCOUNTER — Other Ambulatory Visit: Payer: Self-pay

## 2021-01-04 DIAGNOSIS — K219 Gastro-esophageal reflux disease without esophagitis: Secondary | ICD-10-CM

## 2021-01-04 DIAGNOSIS — I10 Essential (primary) hypertension: Secondary | ICD-10-CM

## 2021-01-04 MED ORDER — OMEPRAZOLE 20 MG PO CPDR: DELAYED_RELEASE_CAPSULE | ORAL | 3 refills | Status: AC

## 2021-01-04 MED ORDER — HYDROCHLOROTHIAZIDE 50 MG PO TABS: 50.0000 mg | ORAL_TABLET | Freq: Every day | ORAL | 3 refills | Status: AC

## 2021-01-04 MED ORDER — LOSARTAN POTASSIUM 100 MG PO TABS: 100.0000 mg | ORAL_TABLET | Freq: Every day | ORAL | 3 refills | Status: AC

## 2021-01-04 NOTE — Telephone Encounter (Signed)
Please send the following to [facility] [address]    Losartan & hydrochlorothiazide (HYDRODIURIL) 50 MG tablet

## 2021-01-04 NOTE — Telephone Encounter (Signed)
Sent refills to pharmacy

## 2021-02-16 ENCOUNTER — Ambulatory Visit: Payer: BC Managed Care – PPO | Admitting: Nurse Practitioner

## 2021-02-22 ENCOUNTER — Other Ambulatory Visit: Payer: Self-pay | Admitting: *Deleted

## 2021-02-22 ENCOUNTER — Telehealth: Payer: Self-pay

## 2021-02-22 DIAGNOSIS — I10 Essential (primary) hypertension: Secondary | ICD-10-CM

## 2021-02-22 MED ORDER — METOPROLOL SUCCINATE ER 50 MG PO TB24: ORAL_TABLET | ORAL | 0 refills | Status: AC

## 2021-02-22 NOTE — Telephone Encounter (Signed)
Rx was sent into pharmacy.  

## 2021-02-22 NOTE — Telephone Encounter (Signed)
Patient called need med refill metoprolol succinate (TOPROL-XL) 50 MG 24 hr tablet  Pharmacy: [facility] [address] [facility]

## 2021-04-20 DIAGNOSIS — F329 Major depressive disorder, single episode, unspecified: Secondary | ICD-10-CM | POA: Diagnosis not present

## 2021-06-13 IMAGING — DX DG PORTABLE PELVIS
1 series · 1 of 1 positions shown · non-contrast
Comparison: 01/03/2019

CLINICAL DATA: Bilateral hip arthroplasty

EXAM:
PORTABLE PELVIS 1-2 VIEWS

[pelvis ap]
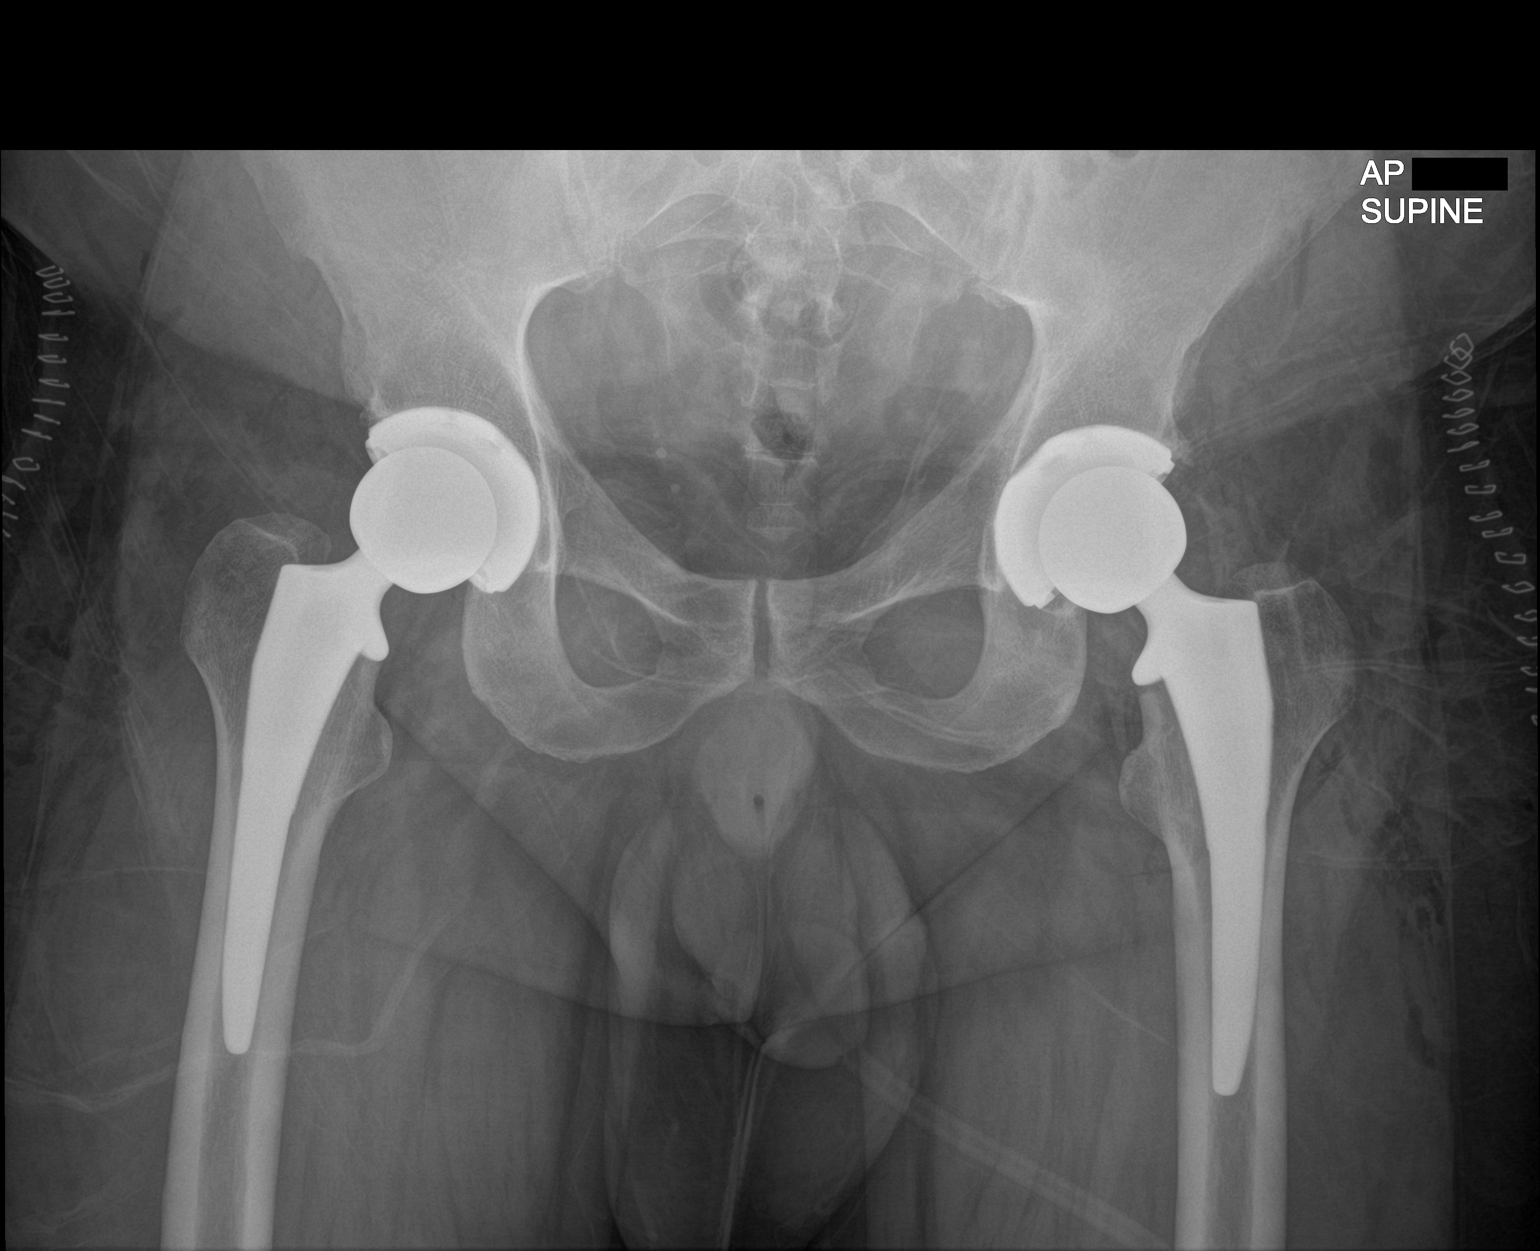

[1 of 1 positions shown; findings below may reference images not displayed]

FINDINGS: Interval postsurgical changes from bilateral total hip arthroplasty.
Arthroplasty components are in their expected alignment without
evidence of complication. Expected postoperative changes within the
overlying soft tissues.
IMPRESSION: Postsurgical changes of bilateral total hip arthroplasty without
evidence of complication.

## 2021-09-29 IMAGING — DX DG CHEST 2V
2 series · 2 of 2 positions shown · non-contrast
Comparison: None.

CLINICAL DATA: Diaphoresis, headache, chest pain, visual deficits

EXAM:
CHEST - 2 VIEW

[chest lat]
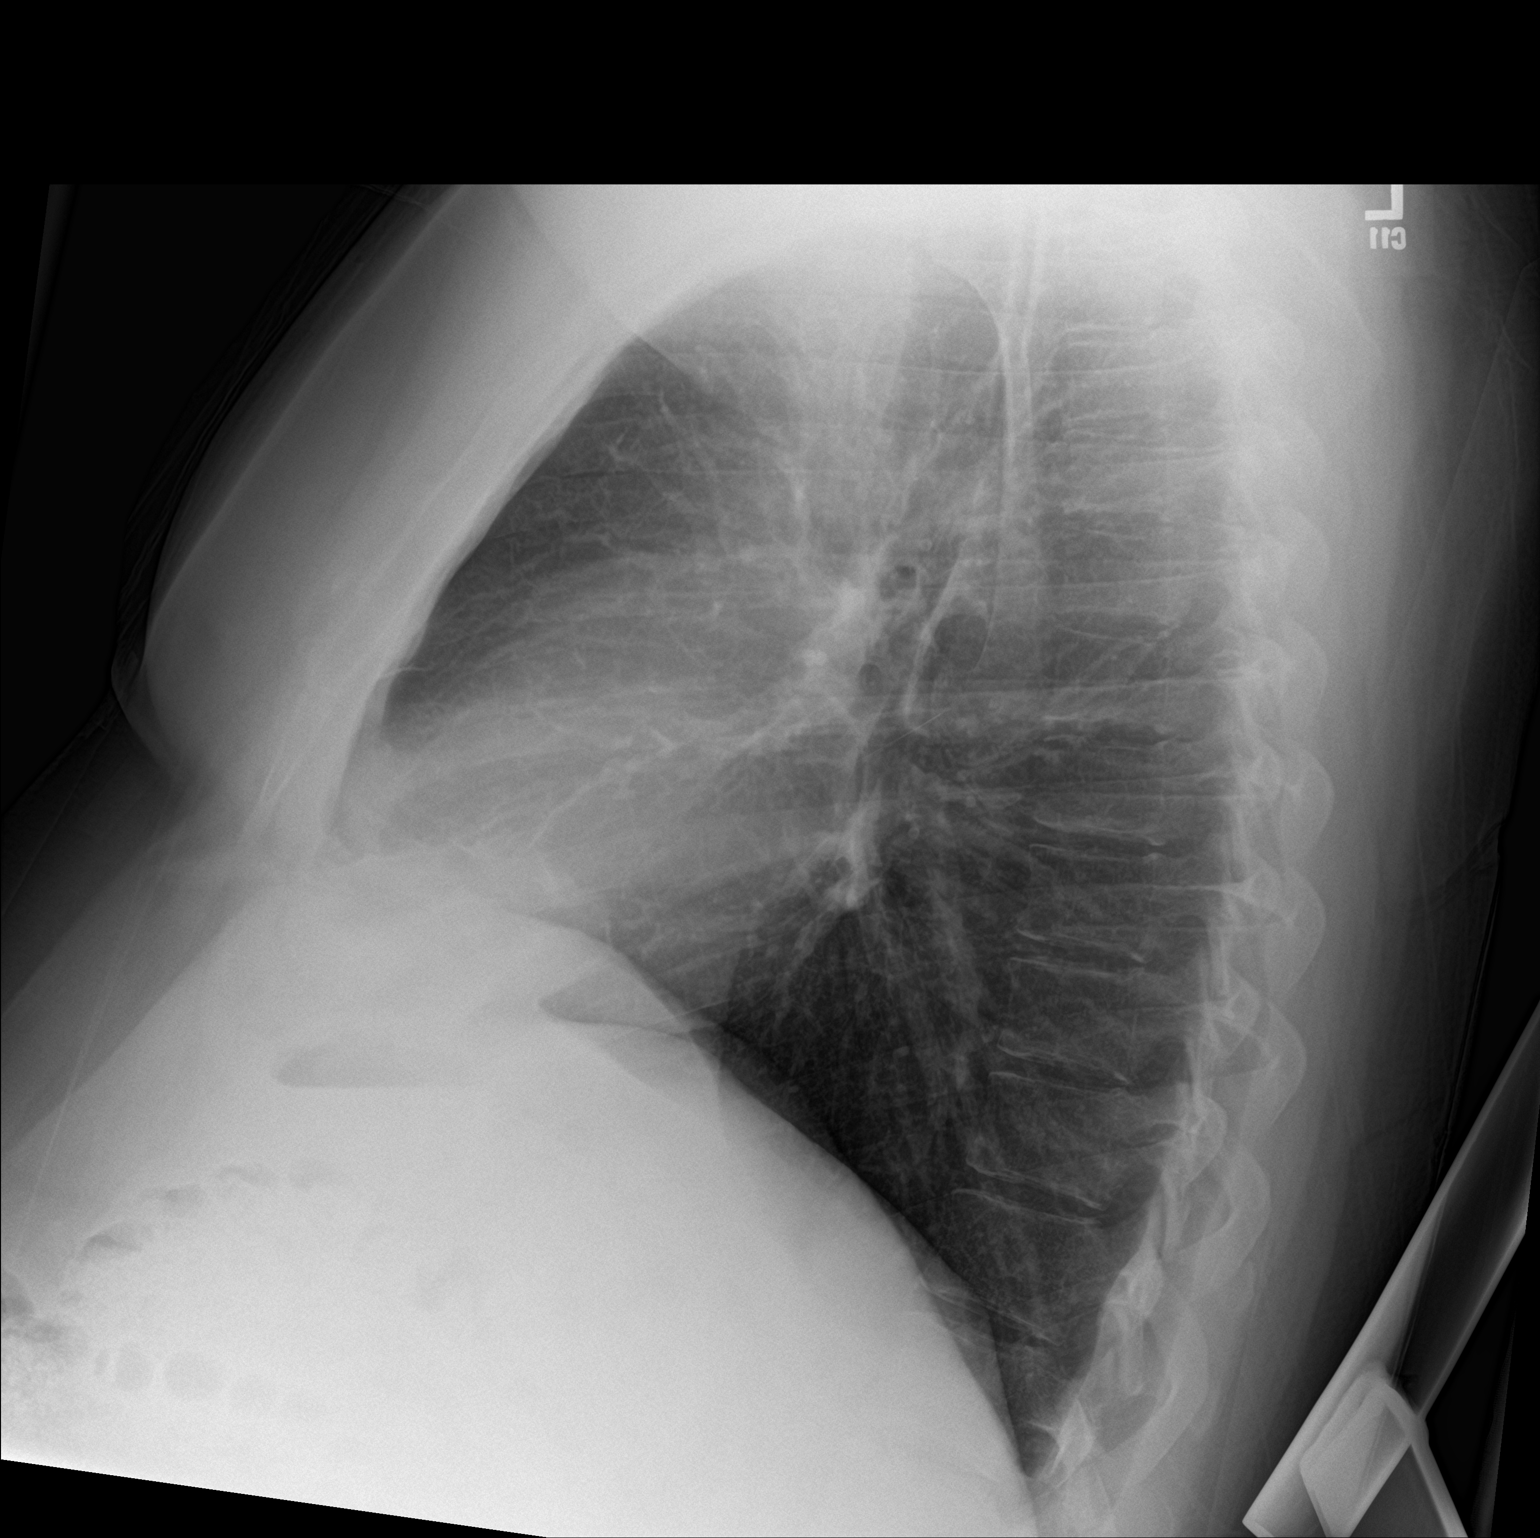

[chest ap]
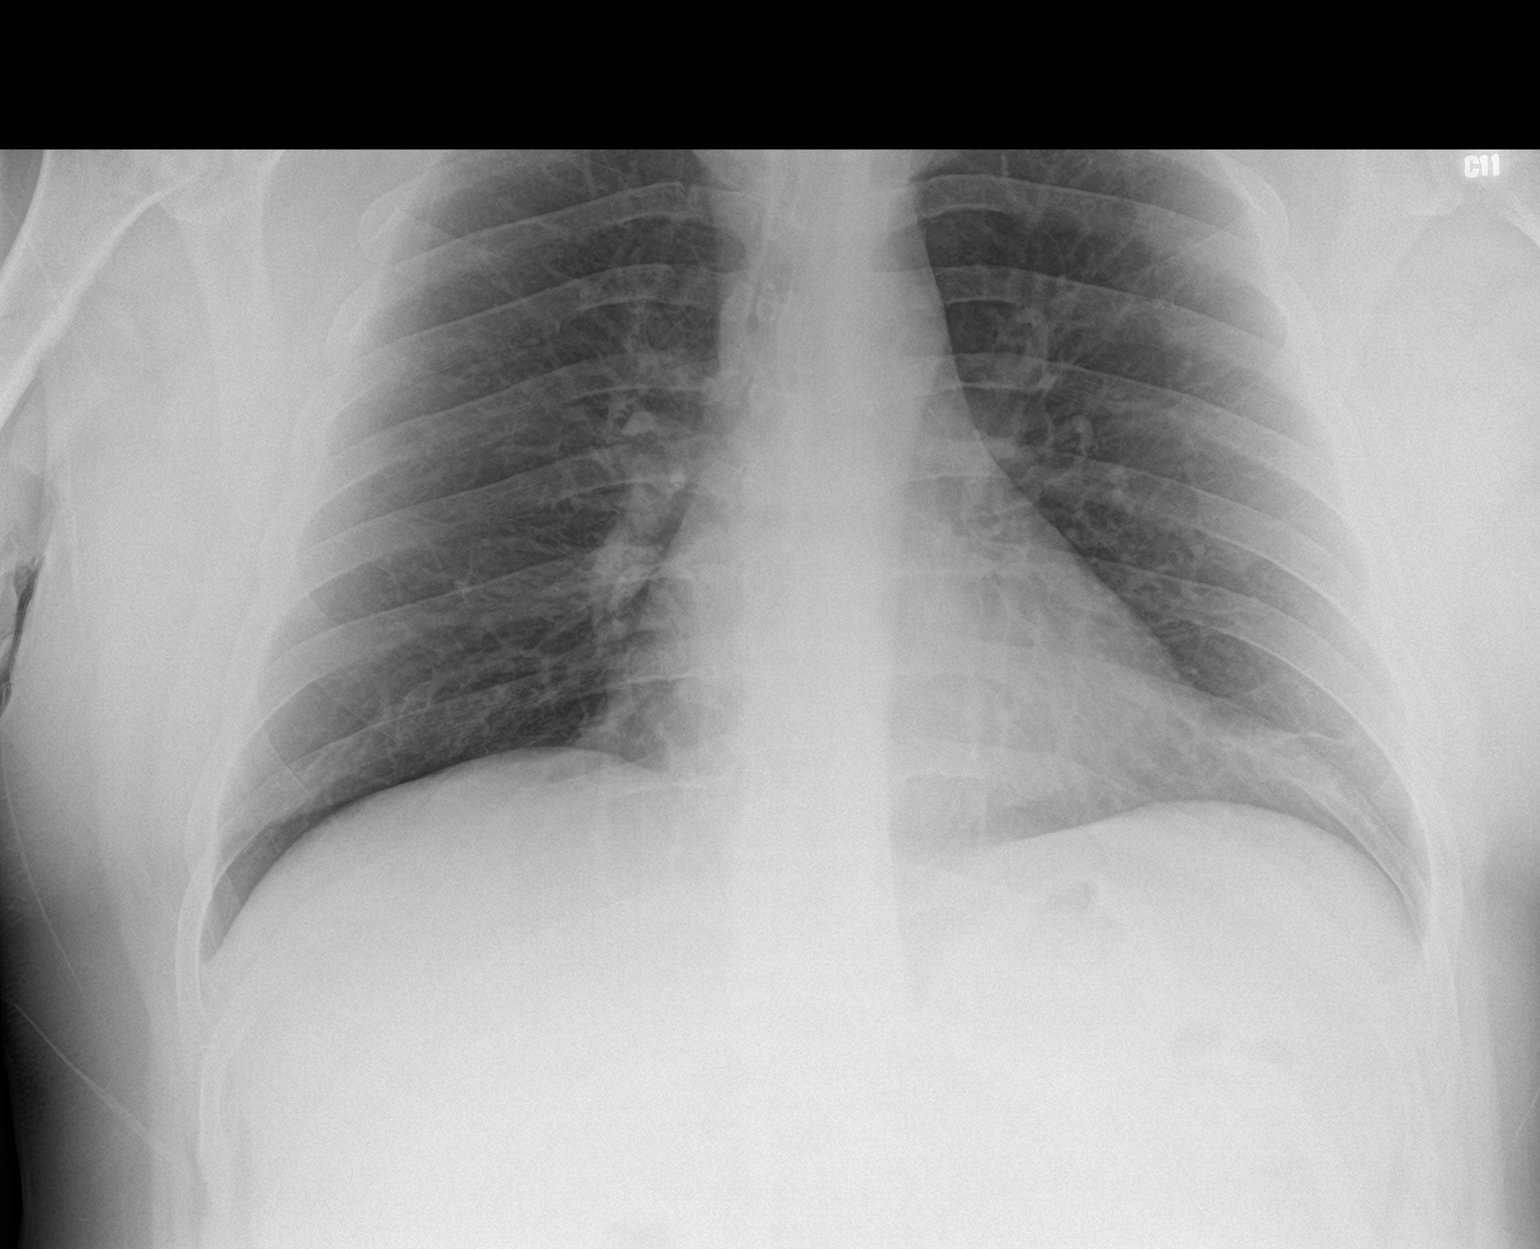

[2 of 2 positions shown; findings below may reference images not displayed]

FINDINGS: The heart size and mediastinal contours are within normal limits.
Both lungs are clear. The visualized skeletal structures are
unremarkable.
IMPRESSION: No active cardiopulmonary disease.
